# Patient Record
Sex: Female | Born: 1940 | Race: White | Hispanic: No | Smoking: Former smoker
Health system: Southern US, Community
[De-identification: ages and names within clinical notes are randomized; demographics above are authoritative.]

## PROBLEM LIST (undated history)

## (undated) DIAGNOSIS — F32A Depression, unspecified: Secondary | ICD-10-CM

## (undated) DIAGNOSIS — E119 Type 2 diabetes mellitus without complications: Secondary | ICD-10-CM

## (undated) DIAGNOSIS — N189 Chronic kidney disease, unspecified: Secondary | ICD-10-CM

## (undated) DIAGNOSIS — K219 Gastro-esophageal reflux disease without esophagitis: Secondary | ICD-10-CM

## (undated) DIAGNOSIS — C801 Malignant (primary) neoplasm, unspecified: Secondary | ICD-10-CM

## (undated) HISTORY — PX: ABDOMINAL HYSTERECTOMY: SHX81

---

## 2011-09-07 HISTORY — PX: OTHER SURGICAL HISTORY: SHX169

## 2013-03-31 DIAGNOSIS — I2510 Atherosclerotic heart disease of native coronary artery without angina pectoris: Secondary | ICD-10-CM | POA: Diagnosis present

## 2017-10-23 DIAGNOSIS — F329 Major depressive disorder, single episode, unspecified: Secondary | ICD-10-CM | POA: Diagnosis present

## 2018-01-24 DIAGNOSIS — I10 Essential (primary) hypertension: Secondary | ICD-10-CM | POA: Diagnosis present

## 2019-12-03 DIAGNOSIS — Z951 Presence of aortocoronary bypass graft: Secondary | ICD-10-CM

## 2021-08-03 ENCOUNTER — Ambulatory Visit (INDEPENDENT_AMBULATORY_CARE_PROVIDER_SITE_OTHER): Payer: Medicare (Managed Care)

## 2021-08-03 ENCOUNTER — Other Ambulatory Visit: Payer: Self-pay

## 2021-08-03 ENCOUNTER — Encounter: Payer: Self-pay | Admitting: Emergency Medicine

## 2021-08-03 ENCOUNTER — Ambulatory Visit
Admission: EM | Admit: 2021-08-03 | Discharge: 2021-08-03 | Disposition: A | Discharge status: Home / Self Care | Payer: Medicare (Managed Care)

## 2021-08-03 DIAGNOSIS — J069 Acute upper respiratory infection, unspecified: Secondary | ICD-10-CM

## 2021-08-03 DIAGNOSIS — R059 Cough, unspecified: Secondary | ICD-10-CM | POA: Diagnosis not present

## 2021-08-03 DIAGNOSIS — N189 Chronic kidney disease, unspecified: Secondary | ICD-10-CM

## 2021-08-03 DIAGNOSIS — K219 Gastro-esophageal reflux disease without esophagitis: Secondary | ICD-10-CM

## 2021-08-03 HISTORY — DX: Chronic kidney disease, unspecified: N18.9

## 2021-08-03 HISTORY — DX: Gastro-esophageal reflux disease without esophagitis: K21.9

## 2021-08-03 MED ORDER — AZITHROMYCIN 250 MG PO TABS: ORAL_TABLET | ORAL | 0 refills | Status: DC

## 2021-08-03 MED ORDER — BENZONATATE 100 MG PO CAPS: 100.0000 mg | ORAL_CAPSULE | Freq: Three times a day (TID) | ORAL | 0 refills | Status: DC

## 2021-08-03 NOTE — ED Triage Notes (Signed)
Pt presents today with c/o of cough with chest congestion ongoing for two weeks. Denies fever.

## 2021-08-03 NOTE — Discharge Instructions (Signed)
Your chest x-ray today did not show any signs of infection however it did show an apical nodule, I would like you to call your primary doctor and have them review the image and give you guidance on what to do next  If it is recommended that she follow-up with her doctor locally I have given you information for pulmonology  Take a Zithromax as prescribed to cover for any infection  Take Tessalon every 8 hours as needed to help calm coughing  May continue use of Mucinex DM to help with congestion and coughing  May follow-up with urgent care as needed

## 2021-08-03 NOTE — ED Provider Notes (Signed)
[facility]    CSN: [id number] Arrival date & time: 08/03/21  1430      History   Chief Complaint Chief Complaint  Patient presents with   Cough   chest congestion    HPI Kari Woods is a 80 y.o. female.   Patient presents with nasal congestion, sore throat, productive cough and increased shortness of breath for 14 days.  Has attempted use of tylenol cold and flu and mucinex DM, somewhat helpful. History of CKD, amyloidosis, GERD, diabetes, bladder cancer. Known sick contacts.  Denies fever, chills, body aches, rhinorrhea, ear pain, headaches, rash wheezing, abdominal pain, nausea, vomiting, diarrhea.  Patient is a resident of [place] and is currently here for winter vacation, with plans to stay until March 2023.    Past Medical History:  Diagnosis Date   CKD (chronic kidney disease)    GERD (gastroesophageal reflux disease)     Patient Active Problem List   Diagnosis Date Noted   GERD (gastroesophageal reflux disease) 08/03/2021   CKD (chronic kidney disease) 08/03/2021    Past Surgical History:  Procedure Laterality Date   ABDOMINAL HYSTERECTOMY      OB History   No obstetric history on file.      Home Medications    Prior to Admission medications   Medication Sig Start Date End Date Taking? Authorizing Provider  pantoprazole (PROTONIX) 40 MG tablet Take by mouth. 07/22/16 08/08/21 Yes [provider]  gabapentin (NEURONTIN) 300 MG capsule Take 300 mg by mouth 2 (two) times daily. 07/16/21   [provider]  sertraline (ZOLOFT) 25 MG tablet Take 25 mg by mouth daily. 07/15/21   [provider]    Family History History reviewed. No pertinent family history.  Social History Social History   Tobacco Use   Smoking status: Former    Types: Cigarettes   Smokeless tobacco: Never  Vaping Use   Vaping Use: Never used  Substance Use Topics   Alcohol use: Not Currently   Drug use: Not Currently     Allergies    Patient has no known allergies.   Review of Systems Review of Systems  Constitutional: Negative.   HENT:  Positive for congestion and sore throat. Negative for dental problem, drooling, ear discharge, ear pain, facial swelling, hearing loss, mouth sores, nosebleeds, postnasal drip, rhinorrhea, sinus pressure, sinus pain, sneezing, tinnitus, trouble swallowing and voice change.   Respiratory:  Positive for cough and shortness of breath. Negative for apnea, choking, chest tightness, wheezing and stridor.   Gastrointestinal: Negative.   Skin: Negative.   Neurological: Negative.     Physical Exam Triage Vital Signs ED Triage Vitals  Enc Vitals Group     BP 08/03/21 1456 104/89     Pulse Rate 08/03/21 1456 90     Resp 08/03/21 1456 18     Temp 08/03/21 1456 97.7 F (36.5 C)     Temp Source 08/03/21 1456 Oral     SpO2 08/03/21 1456 99 %     Weight 08/03/21 1459 110 lb (49.9 kg)     Height 08/03/21 1459 5\' 8"  (1.727 m)     Head Circumference --      Peak Flow --      Pain Score 08/03/21 1458 2     Pain Loc --      Pain Edu? --      Excl. in [place]? --    No data found.  Updated Vital Signs BP 104/89 (BP Location: Left  Arm)   Pulse 90   Temp 97.7 F (36.5 C) (Oral)   Resp 18   Ht 5\' 8"  (1.727 m)   Wt 110 lb (49.9 kg)   SpO2 99%   BMI 16.73 kg/m   Visual Acuity Right Eye Distance:   Left Eye Distance:   Bilateral Distance:    Right Eye Near:   Left Eye Near:    Bilateral Near:     Physical Exam Constitutional:      Appearance: Normal appearance. She is normal weight.  HENT:     Head: Normocephalic.     Right Ear: Tympanic membrane, ear canal and external ear normal.     Left Ear: Tympanic membrane, ear canal and external ear normal.     Nose: Congestion present. No rhinorrhea.     Mouth/Throat:     Mouth: Mucous membranes are moist.     Pharynx: Posterior oropharyngeal erythema present.  Eyes:     Extraocular Movements: Extraocular movements intact.   Cardiovascular:     Rate and Rhythm: Normal rate and regular rhythm.     Pulses: Normal pulses.     Heart sounds: Normal heart sounds.  Pulmonary:     Effort: Pulmonary effort is normal.     Breath sounds: Normal breath sounds.  Musculoskeletal:     Cervical back: Normal range of motion and neck supple.  Skin:    General: Skin is warm and dry.  Neurological:     Mental Status: She is alert and oriented to person, place, and time. Mental status is at baseline.  Psychiatric:        Mood and Affect: Mood normal.        Behavior: Behavior normal.     UC Treatments / Results  Labs (all labs ordered are listed, but only abnormal results are displayed) Labs Reviewed - No data to display  EKG   Radiology No results found.  Procedures Procedures (including critical care time)  Medications Ordered in UC Medications - No data to display  Initial Impression / Assessment and Plan / UC Course  I have reviewed the triage vital signs and the nursing notes.  Pertinent labs & imaging results that were available during my care of the patient were reviewed by me and considered in my medical decision making (see chart for details).  Viral URI with cough  1.  Chest x-ray negative for infection, showing apical nodularities, discussed findings with patient, recommended notifying physician in [place] for review of imaging and recommended follow-up, given walking referral to local pulmonologist if follow-up required, unable to review patient's most recent imaging 2.  Zithromax 500 mg once then 250 mg for 4 days 3.  Tessalon 100 mg 3 times daily as needed 4.  Recommended continued use of Mucinex DM extra strength 5.  Urgent care follow-up as needed Final Clinical Impressions(s) / UC Diagnoses   Final diagnoses:  None   Discharge Instructions   None    ED Prescriptions   None    PDMP not reviewed this encounter.   Hans Eden, NP 08/03/21 [phone]

## 2021-10-07 ENCOUNTER — Inpatient Hospital Stay
Admission: EM | Admit: 2021-10-07 | Discharge: 2021-10-11 | DRG: 698 | Disposition: A | Discharge status: Home / Self Care | Payer: Medicare (Managed Care) | Attending: Family Medicine | Admitting: Family Medicine

## 2021-10-07 ENCOUNTER — Emergency Department: Payer: Medicare (Managed Care)

## 2021-10-07 ENCOUNTER — Encounter: Payer: Self-pay | Admitting: Internal Medicine

## 2021-10-07 ENCOUNTER — Other Ambulatory Visit: Payer: Self-pay

## 2021-10-07 DIAGNOSIS — I2510 Atherosclerotic heart disease of native coronary artery without angina pectoris: Secondary | ICD-10-CM | POA: Diagnosis present

## 2021-10-07 DIAGNOSIS — N390 Urinary tract infection, site not specified: Secondary | ICD-10-CM | POA: Diagnosis present

## 2021-10-07 DIAGNOSIS — E1165 Type 2 diabetes mellitus with hyperglycemia: Secondary | ICD-10-CM | POA: Diagnosis present

## 2021-10-07 DIAGNOSIS — T83511A Infection and inflammatory reaction due to indwelling urethral catheter, initial encounter: Secondary | ICD-10-CM | POA: Diagnosis not present

## 2021-10-07 DIAGNOSIS — D696 Thrombocytopenia, unspecified: Secondary | ICD-10-CM | POA: Diagnosis present

## 2021-10-07 DIAGNOSIS — Z90710 Acquired absence of both cervix and uterus: Secondary | ICD-10-CM | POA: Diagnosis not present

## 2021-10-07 DIAGNOSIS — E1122 Type 2 diabetes mellitus with diabetic chronic kidney disease: Secondary | ICD-10-CM | POA: Diagnosis present

## 2021-10-07 DIAGNOSIS — E785 Hyperlipidemia, unspecified: Secondary | ICD-10-CM | POA: Diagnosis present

## 2021-10-07 DIAGNOSIS — E872 Acidosis, unspecified: Secondary | ICD-10-CM | POA: Diagnosis present

## 2021-10-07 DIAGNOSIS — Z906 Acquired absence of other parts of urinary tract: Secondary | ICD-10-CM

## 2021-10-07 DIAGNOSIS — E876 Hypokalemia: Secondary | ICD-10-CM | POA: Diagnosis present

## 2021-10-07 DIAGNOSIS — J439 Emphysema, unspecified: Secondary | ICD-10-CM | POA: Diagnosis present

## 2021-10-07 DIAGNOSIS — I129 Hypertensive chronic kidney disease with stage 1 through stage 4 chronic kidney disease, or unspecified chronic kidney disease: Secondary | ICD-10-CM | POA: Diagnosis present

## 2021-10-07 DIAGNOSIS — Z8551 Personal history of malignant neoplasm of bladder: Secondary | ICD-10-CM | POA: Diagnosis not present

## 2021-10-07 DIAGNOSIS — Z853 Personal history of malignant neoplasm of breast: Secondary | ICD-10-CM

## 2021-10-07 DIAGNOSIS — K219 Gastro-esophageal reflux disease without esophagitis: Secondary | ICD-10-CM | POA: Diagnosis present

## 2021-10-07 DIAGNOSIS — Z681 Body mass index (BMI) 19 or less, adult: Secondary | ICD-10-CM

## 2021-10-07 DIAGNOSIS — Y846 Urinary catheterization as the cause of abnormal reaction of the patient, or of later complication, without mention of misadventure at the time of the procedure: Secondary | ICD-10-CM | POA: Diagnosis present

## 2021-10-07 DIAGNOSIS — F329 Major depressive disorder, single episode, unspecified: Secondary | ICD-10-CM | POA: Diagnosis present

## 2021-10-07 DIAGNOSIS — Z79899 Other long term (current) drug therapy: Secondary | ICD-10-CM

## 2021-10-07 DIAGNOSIS — Z634 Disappearance and death of family member: Secondary | ICD-10-CM

## 2021-10-07 DIAGNOSIS — T83518A Infection and inflammatory reaction due to other urinary catheter, initial encounter: Principal | ICD-10-CM | POA: Diagnosis present

## 2021-10-07 DIAGNOSIS — Z960 Presence of urogenital implants: Secondary | ICD-10-CM | POA: Diagnosis not present

## 2021-10-07 DIAGNOSIS — E43 Unspecified severe protein-calorie malnutrition: Secondary | ICD-10-CM | POA: Diagnosis present

## 2021-10-07 DIAGNOSIS — Z951 Presence of aortocoronary bypass graft: Secondary | ICD-10-CM

## 2021-10-07 DIAGNOSIS — Z794 Long term (current) use of insulin: Secondary | ICD-10-CM | POA: Diagnosis not present

## 2021-10-07 DIAGNOSIS — Z789 Other specified health status: Secondary | ICD-10-CM

## 2021-10-07 DIAGNOSIS — Z8673 Personal history of transient ischemic attack (TIA), and cerebral infarction without residual deficits: Secondary | ICD-10-CM | POA: Diagnosis not present

## 2021-10-07 DIAGNOSIS — F332 Major depressive disorder, recurrent severe without psychotic features: Secondary | ICD-10-CM | POA: Diagnosis present

## 2021-10-07 DIAGNOSIS — Z91030 Bee allergy status: Secondary | ICD-10-CM

## 2021-10-07 DIAGNOSIS — Z87891 Personal history of nicotine dependence: Secondary | ICD-10-CM

## 2021-10-07 DIAGNOSIS — Z66 Do not resuscitate: Secondary | ICD-10-CM | POA: Diagnosis present

## 2021-10-07 DIAGNOSIS — Z8572 Personal history of non-Hodgkin lymphomas: Secondary | ICD-10-CM

## 2021-10-07 DIAGNOSIS — Z87440 Personal history of urinary (tract) infections: Secondary | ICD-10-CM | POA: Diagnosis not present

## 2021-10-07 DIAGNOSIS — N1832 Chronic kidney disease, stage 3b: Secondary | ICD-10-CM | POA: Diagnosis present

## 2021-10-07 DIAGNOSIS — R1310 Dysphagia, unspecified: Secondary | ICD-10-CM | POA: Diagnosis not present

## 2021-10-07 DIAGNOSIS — Z20822 Contact with and (suspected) exposure to covid-19: Secondary | ICD-10-CM | POA: Diagnosis present

## 2021-10-07 DIAGNOSIS — G4700 Insomnia, unspecified: Secondary | ICD-10-CM | POA: Diagnosis present

## 2021-10-07 DIAGNOSIS — I10 Essential (primary) hypertension: Secondary | ICD-10-CM | POA: Diagnosis present

## 2021-10-07 DIAGNOSIS — A419 Sepsis, unspecified organism: Secondary | ICD-10-CM

## 2021-10-07 DIAGNOSIS — T83510A Infection and inflammatory reaction due to cystostomy catheter, initial encounter: Secondary | ICD-10-CM | POA: Diagnosis not present

## 2021-10-07 HISTORY — DX: Malignant (primary) neoplasm, unspecified: C80.1

## 2021-10-07 HISTORY — DX: Type 2 diabetes mellitus without complications: E11.9

## 2021-10-07 HISTORY — DX: Depression, unspecified: F32.A

## 2021-10-07 LAB — CBC WITH DIFFERENTIAL/PLATELET
Abs Immature Granulocytes: 0.07 10*3/uL (ref 0.00–0.07)
Basophils Absolute: 0 10*3/uL (ref 0.0–0.1)
Basophils Relative: 0 %
Eosinophils Absolute: 0 10*3/uL (ref 0.0–0.5)
Eosinophils Relative: 0 %
HCT: 34.7 % — ABNORMAL LOW (ref 36.0–46.0)
Hemoglobin: 11.7 g/dL — ABNORMAL LOW (ref 12.0–15.0)
Immature Granulocytes: 1 %
Lymphocytes Relative: 5 %
Lymphs Abs: 0.6 10*3/uL — ABNORMAL LOW (ref 0.7–4.0)
MCH: 30.2 pg (ref 26.0–34.0)
MCHC: 33.7 g/dL (ref 30.0–36.0)
MCV: 89.4 fL (ref 80.0–100.0)
Monocytes Absolute: 1.1 10*3/uL — ABNORMAL HIGH (ref 0.1–1.0)
Monocytes Relative: 9 %
Neutro Abs: 9.6 10*3/uL — ABNORMAL HIGH (ref 1.7–7.7)
Neutrophils Relative %: 85 %
Platelets: 156 10*3/uL (ref 150–400)
RBC: 3.88 MIL/uL (ref 3.87–5.11)
RDW: 13.5 % (ref 11.5–15.5)
WBC: 11.3 10*3/uL — ABNORMAL HIGH (ref 4.0–10.5)
nRBC: 0 % (ref 0.0–0.2)

## 2021-10-07 LAB — RESP PANEL BY RT-PCR (FLU A&B, COVID) ARPGX2
Influenza A by PCR: NEGATIVE
Influenza B by PCR: NEGATIVE
SARS Coronavirus 2 by RT PCR: NEGATIVE

## 2021-10-07 LAB — COMPREHENSIVE METABOLIC PANEL
ALT: 13 U/L (ref 0–44)
AST: 16 U/L (ref 15–41)
Albumin: 3.2 g/dL — ABNORMAL LOW (ref 3.5–5.0)
Alkaline Phosphatase: 68 U/L (ref 38–126)
Anion gap: 7 (ref 5–15)
BUN: 53 mg/dL — ABNORMAL HIGH (ref 8–23)
CO2: 18 mmol/L — ABNORMAL LOW (ref 22–32)
Calcium: 8.5 mg/dL — ABNORMAL LOW (ref 8.9–10.3)
Chloride: 111 mmol/L (ref 98–111)
Creatinine, Ser: 1.53 mg/dL — ABNORMAL HIGH (ref 0.44–1.00)
GFR, Estimated: 35 mL/min — ABNORMAL LOW (ref >60)
Glucose, Bld: 247 mg/dL — ABNORMAL HIGH (ref 70–99)
Potassium: 3.2 mmol/L — ABNORMAL LOW (ref 3.5–5.1)
Sodium: 136 mmol/L (ref 135–145)
Total Bilirubin: 1 mg/dL (ref 0.3–1.2)
Total Protein: 6.6 g/dL (ref 6.5–8.1)

## 2021-10-07 LAB — URINALYSIS, COMPLETE (UACMP) WITH MICROSCOPIC
Bilirubin Urine: NEGATIVE
Glucose, UA: 100 mg/dL — AB
Ketones, ur: NEGATIVE mg/dL
Nitrite: NEGATIVE
Protein, ur: 100 mg/dL — AB
Specific Gravity, Urine: 1.015 (ref 1.005–1.030)
pH: 8.5 — ABNORMAL HIGH (ref 5.0–8.0)

## 2021-10-07 LAB — LACTIC ACID, PLASMA
Lactic Acid, Venous: 1 mmol/L (ref 0.5–1.9)
Lactic Acid, Venous: 1.1 mmol/L (ref 0.5–1.9)

## 2021-10-07 LAB — APTT: aPTT: 32 seconds (ref 24–36)

## 2021-10-07 LAB — PROTIME-INR
INR: 1.2 (ref 0.8–1.2)
Prothrombin Time: 15.4 seconds — ABNORMAL HIGH (ref 11.4–15.2)

## 2021-10-07 MED ORDER — SODIUM CHLORIDE 0.9 % IV BOLUS (SEPSIS)
1000.0000 mL | Freq: Once | INTRAVENOUS | Status: AC
  Administered 2021-10-07: 1000 mL via INTRAVENOUS

## 2021-10-07 MED ORDER — SODIUM CHLORIDE 0.9 % IV SOLN
2.0000 g | Freq: Once | INTRAVENOUS | Status: AC
  Administered 2021-10-07: 2 g via INTRAVENOUS
  Filled 2021-10-07: qty 20

## 2021-10-07 MED ORDER — LACTATED RINGERS IV SOLN: INTRAVENOUS | Status: AC

## 2021-10-07 NOTE — ED Triage Notes (Addendum)
Pt arrives with [facility] from home c/o possible UTI; pt is visiting from out of town and has hx of sepsis and UTI. Pt has suprapubic cath and reports "dark brown/bloody urine". PT given 500 ml fluid bolus by EMS. Daughter is POA and lives in [place].  EMS vitals:  100.7 oral temp 132/67 BP RR 24 96% O2 on RA 95 HR

## 2021-10-07 NOTE — Sepsis Progress Note (Signed)
Following for sepsis monitoring ?

## 2021-10-07 NOTE — ED Provider Notes (Signed)
[facility] Provider Note    Event Date/Time   First MD Initiated Contact with Patient 10/07/21 1922     (approximate)   History   Urinary Tract Infection   HPI  Kari Woods is a 81 y.o. female with history of chronic kidney disease, GERD who self caths who presents with dark urine and fever.  Patient reports her urine is dark and foul-smelling which is typical of when she has a urinary tract infection.  She also reports a fever at home of 101.0.  Denies significant abdominal pain.  No flank pain reported     Physical Exam   Triage Vital Signs: ED Triage Vitals  Enc Vitals Group     BP 10/07/21 1915 139/68     Pulse Rate 10/07/21 1915 95     Resp 10/07/21 2000 16     Temp 10/07/21 1915 (S) (!) 102.9 F (39.4 C)     Temp Source 10/07/21 1915 Rectal     SpO2 10/07/21 1915 97 %     Weight --      Height --      Head Circumference --      Peak Flow --      Pain Score --      Pain Loc --      Pain Edu? --      Excl. in [place]? --     Most recent vital signs: Vitals:   10/07/21 2100 10/07/21 2130  BP: (!) 141/65 118/90  Pulse: 80 77  Resp: 19 (!) 24  Temp:    SpO2: 100% 100%     General: Awake, no distress.  CV:  Good peripheral perfusion.  Regular rate and rhythm Resp:  Normal effort.  Abd:  No distention.  No abdominal tenderness to palpation no CVA tenderness    ED Results / Procedures / Treatments   Labs (all labs ordered are listed, but only abnormal results are displayed) Labs Reviewed  COMPREHENSIVE METABOLIC PANEL - Abnormal; Notable for the following components:      Result Value   Potassium 3.2 (*)    CO2 18 (*)    Glucose, Bld 247 (*)    BUN 53 (*)    Creatinine, Ser 1.53 (*)    Calcium 8.5 (*)    Albumin 3.2 (*)    GFR, Estimated 35 (*)    All other components within normal limits  CBC WITH DIFFERENTIAL/PLATELET - Abnormal; Notable for the following components:   WBC 11.3 (*)    Hemoglobin 11.7 (*)    HCT  34.7 (*)    Neutro Abs 9.6 (*)    Lymphs Abs 0.6 (*)    Monocytes Absolute 1.1 (*)    All other components within normal limits  PROTIME-INR - Abnormal; Notable for the following components:   Prothrombin Time 15.4 (*)    All other components within normal limits  RESP PANEL BY RT-PCR (FLU A&B, COVID) ARPGX2  CULTURE, BLOOD (ROUTINE X 2)  CULTURE, BLOOD (ROUTINE X 2)  URINE CULTURE  LACTIC ACID, PLASMA  LACTIC ACID, PLASMA  APTT  URINALYSIS, COMPLETE (UACMP) WITH MICROSCOPIC     EKG ED ECG REPORT I, Lavonia Drafts, the attending physician, personally viewed and interpreted this ECG.  Date: 10/07/2021   QRS Axis: normal Intervals: normal ST/T Wave abnormalities: normal Narrative Interpretation: no evidence of acute ischemia     RADIOLOGY Chest x-ray reviewed by me, no acute abnormality    PROCEDURES:  Critical Care performed:  Procedures   MEDICATIONS ORDERED IN ED: Medications  lactated ringers infusion ( Intravenous New Bag/Given 10/07/21 2204)  sodium chloride 0.9 % bolus 1,000 mL (0 mLs Intravenous Stopped 10/07/21 2236)  cefTRIAXone (ROCEPHIN) 2 g in sodium chloride 0.9 % 100 mL IVPB (0 g Intravenous Stopped 10/07/21 2147)     IMPRESSION / MDM / ASSESSMENT AND PLAN / ED COURSE  I reviewed the triage vital signs and the nursing notes.  Patient presents with history of self cath, dark urine, foul-smelling.  She has a fever of 481.8 here, certainly suspicious for sepsis/UTI  Lab work is notable for white blood cell count of 11.3, lactic acid is reassuring.  CMP notable for creatinine of 1.53, patient has a history of chronic kidney disease  Pending urinalysis, will treat with IV Rocephin given high likelihood of infection  COVID and flu tests are negative chest x-ray without evidence of pneumonia          FINAL CLINICAL IMPRESSION(S) / ED DIAGNOSES   Final diagnoses:  Sepsis without acute organ dysfunction, due to unspecified organism [facility])      Rx / DC Orders   ED Discharge Orders     None        Note:  This document was prepared using Dragon voice recognition software and may include unintentional dictation errors.   Lavonia Drafts, MD 10/07/21 2239

## 2021-10-07 NOTE — Progress Notes (Signed)
CODE SEPSIS - PHARMACY COMMUNICATION  **Broad Spectrum Antibiotics should be administered within 1 hour of Sepsis diagnosis**  Time Code Sepsis Called/Page Received: 1937  Antibiotics Ordered: ceftriaxone 1 gram x 1  Time of 1st antibiotic administration: 2043  Additional action taken by pharmacy: messaged @2027   If necessary, Name of Provider/Nurse Contacted: messaged RN    Wynelle Cleveland, PharmD Pharmacy Resident  10/07/2021 7:37 PM

## 2021-10-08 ENCOUNTER — Encounter: Payer: Self-pay | Admitting: Internal Medicine

## 2021-10-08 DIAGNOSIS — T83510A Infection and inflammatory reaction due to cystostomy catheter, initial encounter: Secondary | ICD-10-CM

## 2021-10-08 DIAGNOSIS — N390 Urinary tract infection, site not specified: Secondary | ICD-10-CM | POA: Diagnosis not present

## 2021-10-08 DIAGNOSIS — Z8551 Personal history of malignant neoplasm of bladder: Secondary | ICD-10-CM

## 2021-10-08 DIAGNOSIS — E1165 Type 2 diabetes mellitus with hyperglycemia: Secondary | ICD-10-CM

## 2021-10-08 DIAGNOSIS — F332 Major depressive disorder, recurrent severe without psychotic features: Secondary | ICD-10-CM

## 2021-10-08 DIAGNOSIS — E876 Hypokalemia: Secondary | ICD-10-CM

## 2021-10-08 DIAGNOSIS — Z8673 Personal history of transient ischemic attack (TIA), and cerebral infarction without residual deficits: Secondary | ICD-10-CM

## 2021-10-08 DIAGNOSIS — Z794 Long term (current) use of insulin: Secondary | ICD-10-CM

## 2021-10-08 LAB — CBC
HCT: 30 % — ABNORMAL LOW (ref 36.0–46.0)
Hemoglobin: 10.2 g/dL — ABNORMAL LOW (ref 12.0–15.0)
MCH: 30.3 pg (ref 26.0–34.0)
MCHC: 34 g/dL (ref 30.0–36.0)
MCV: 89 fL (ref 80.0–100.0)
Platelets: 140 10*3/uL — ABNORMAL LOW (ref 150–400)
RBC: 3.37 MIL/uL — ABNORMAL LOW (ref 3.87–5.11)
RDW: 13.3 % (ref 11.5–15.5)
WBC: 10 10*3/uL (ref 4.0–10.5)
nRBC: 0 % (ref 0.0–0.2)

## 2021-10-08 LAB — HEMOGLOBIN A1C
Hgb A1c MFr Bld: 7.5 % — ABNORMAL HIGH (ref 4.8–5.6)
Mean Plasma Glucose: 169 mg/dL

## 2021-10-08 LAB — BASIC METABOLIC PANEL
Anion gap: 4 — ABNORMAL LOW (ref 5–15)
BUN: 45 mg/dL — ABNORMAL HIGH (ref 8–23)
CO2: 18 mmol/L — ABNORMAL LOW (ref 22–32)
Calcium: 7.8 mg/dL — ABNORMAL LOW (ref 8.9–10.3)
Chloride: 114 mmol/L — ABNORMAL HIGH (ref 98–111)
Creatinine, Ser: 1.45 mg/dL — ABNORMAL HIGH (ref 0.44–1.00)
GFR, Estimated: 37 mL/min — ABNORMAL LOW (ref >60)
Glucose, Bld: 208 mg/dL — ABNORMAL HIGH (ref 70–99)
Potassium: 2.7 mmol/L — CL (ref 3.5–5.1)
Sodium: 136 mmol/L (ref 135–145)

## 2021-10-08 LAB — GLUCOSE, CAPILLARY
Glucose-Capillary: 239 mg/dL — ABNORMAL HIGH (ref 70–99)
Glucose-Capillary: 155 mg/dL — ABNORMAL HIGH (ref 70–99)
Glucose-Capillary: 198 mg/dL — ABNORMAL HIGH (ref 70–99)
Glucose-Capillary: 144 mg/dL — ABNORMAL HIGH (ref 70–99)
Glucose-Capillary: 193 mg/dL — ABNORMAL HIGH (ref 70–99)

## 2021-10-08 LAB — POTASSIUM: Potassium: 3.5 mmol/L (ref 3.5–5.1)

## 2021-10-08 MED ORDER — SERTRALINE HCL 50 MG PO TABS
50.0000 mg | ORAL_TABLET | Freq: Every day | ORAL | Status: DC
  Administered 2021-10-08: 50 mg via ORAL
  Filled 2021-10-08: qty 1

## 2021-10-08 MED ORDER — ESCITALOPRAM OXALATE 10 MG PO TABS
10.0000 mg | ORAL_TABLET | Freq: Every day | ORAL | Status: DC
  Administered 2021-10-08 – 2021-10-09 (×2): 10 mg via ORAL
  Filled 2021-10-08 (×2): qty 1

## 2021-10-08 MED ORDER — ENOXAPARIN SODIUM 30 MG/0.3ML IJ SOSY
30.0000 mg | PREFILLED_SYRINGE | INTRAMUSCULAR | Status: DC
  Administered 2021-10-08 – 2021-10-11 (×4): 30 mg via SUBCUTANEOUS
  Filled 2021-10-08 (×4): qty 0.3

## 2021-10-08 MED ORDER — ACETAMINOPHEN 650 MG RE SUPP: 650.0000 mg | Freq: Four times a day (QID) | RECTAL | Status: DC | PRN

## 2021-10-08 MED ORDER — POTASSIUM CHLORIDE CRYS ER 20 MEQ PO TBCR
40.0000 meq | EXTENDED_RELEASE_TABLET | Freq: Once | ORAL | Status: AC
  Administered 2021-10-08: 40 meq via ORAL
  Filled 2021-10-08: qty 2

## 2021-10-08 MED ORDER — ACETAMINOPHEN 325 MG PO TABS
650.0000 mg | ORAL_TABLET | Freq: Four times a day (QID) | ORAL | Status: DC | PRN
  Administered 2021-10-08 – 2021-10-10 (×2): 650 mg via ORAL
  Filled 2021-10-08 (×2): qty 2

## 2021-10-08 MED ORDER — ONDANSETRON HCL 4 MG/2ML IJ SOLN
4.0000 mg | Freq: Four times a day (QID) | INTRAMUSCULAR | Status: DC | PRN
  Administered 2021-10-09: 4 mg via INTRAVENOUS
  Filled 2021-10-08: qty 2

## 2021-10-08 MED ORDER — POTASSIUM CHLORIDE 10 MEQ/100ML IV SOLN: 10.0000 meq | INTRAVENOUS | Status: DC

## 2021-10-08 MED ORDER — ONDANSETRON HCL 4 MG PO TABS: 4.0000 mg | ORAL_TABLET | Freq: Four times a day (QID) | ORAL | Status: DC | PRN

## 2021-10-08 MED ORDER — POTASSIUM CHLORIDE 10 MEQ/100ML IV SOLN
10.0000 meq | INTRAVENOUS | Status: AC
  Administered 2021-10-08 (×2): 10 meq via INTRAVENOUS
  Filled 2021-10-08 (×2): qty 100

## 2021-10-08 MED ORDER — SODIUM CHLORIDE 0.9 % IV SOLN
1.0000 g | Freq: Two times a day (BID) | INTRAVENOUS | Status: DC
  Administered 2021-10-08 – 2021-10-10 (×5): 1 g via INTRAVENOUS
  Filled 2021-10-08 (×7): qty 1

## 2021-10-08 MED ORDER — INSULIN ASPART 100 UNIT/ML IJ SOLN
0.0000 [IU] | Freq: Every day | INTRAMUSCULAR | Status: DC
  Administered 2021-10-08: 2 [IU] via SUBCUTANEOUS
  Filled 2021-10-08 (×2): qty 1

## 2021-10-08 MED ORDER — INSULIN ASPART 100 UNIT/ML IJ SOLN
0.0000 [IU] | Freq: Three times a day (TID) | INTRAMUSCULAR | Status: DC
  Administered 2021-10-08 – 2021-10-09 (×4): 3 [IU] via SUBCUTANEOUS
  Administered 2021-10-09 (×2): 2 [IU] via SUBCUTANEOUS
  Administered 2021-10-10: 3 [IU] via SUBCUTANEOUS
  Administered 2021-10-10 (×2): 2 [IU] via SUBCUTANEOUS
  Filled 2021-10-08 (×9): qty 1

## 2021-10-08 NOTE — ED Notes (Signed)
Pt's daughter Mykia Holton is pt's POA (in [place]): [phone]

## 2021-10-08 NOTE — Consult Note (Signed)
[facility] Consult   Reason for Consult: Consult for 81 year old woman admitted to the hospital with multiple medical problems related to kidney disease.  Concern about depression. Referring Physician: [place] Crane Patient Identification: Kari Woods MRN:  [id number] Principal Diagnosis: Severe recurrent major depression without psychotic features ([place]) Diagnosis:  Principal Problem:   Severe recurrent major depression without psychotic features ([place]) Active Problems:   Stage 3b chronic kidney disease ([place])   Self-catheterizes continent urinary pouch ([place] pouch)   Catheter-associated urinary tract infection ([place])   Frequent UTI   Coronary artery disease involving native coronary artery   Hypertension, essential   Major depressive disorder   S/P coronary artery bypass graft x 5   History of TIA (transient ischemic attack)   History of bladder cancer s/p cystectectomy with [place] pouch   Uncontrolled type 2 diabetes mellitus with hyperglycemia, with long-term current use of insulin (HCC)   Hypokalemia   Total Time spent with patient: 45 minutes  Subjective:   Kari Woods is a 81 y.o. female patient admitted with "I have been depressed".  HPI: Patient seen and chart reviewed.  81 year old woman with multiple medical problems admitted to the hospital with abnormal laboratory findings chronic and worsening kidney disease.  Patient noted to be showing multiple symptoms of depression.  Patient states that her mood does feel depressed.  She feels sad and negative most of the time.  She says that she loves all of her family but gets little direct enjoyment from many of the grandchildren her children because they do not have the time to spend with her.  She has very little otherwise in her life that she enjoys spending most of her time just watching television.  Sleep is poor.  Appetite somewhat decreased.  Patient admits that she has thoughts at times of wishing to die but  adamantly states that she would never try to kill herself that she has philosophical and religious feelings against it.  She is willing and appropriate about cooperating with medical care.  Denies any hallucinations or psychotic symptoms.  Patient has been on Zoloft 50 mg a day for what she says is about 8 months.  The patient lives in [place] but has been in [place] since November visiting her sister and was planning to stay for at least another couple months.  Patient reports that the Zoloft has not been effective as far as she is concerned at any time.  Past Psychiatric History: Past history of depression for at least 8 months and in fact she says she started to have problems with her mood after her husband died in 12-17-2016.  No previous treatment before that and no history of suicide attempts.  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 in remission from all 3 since Dec 18, 2015   CKD (chronic kidney disease)    stage 3   Depression    Diabetes mellitus without complication (HCC)    GERD (gastroesophageal reflux disease)     Past Surgical History:  Procedure Laterality Date   ABDOMINAL HYSTERECTOMY     total   triple bypass  09/07/2011   Family History: History reviewed. No pertinent family history. Family Psychiatric  History: Unaware of any Social History:  Social History   Substance and  Sexual Activity  Alcohol Use Not Currently     Social History   Substance and Sexual Activity  Drug Use Not Currently    Social History   Socioeconomic History   Marital status: Widowed    Spouse name: Not on file   Number of children: Not on file   Years of education: Not on file   Highest education level: Not on file  Occupational History   Not on file  Tobacco Use   Smoking  status: Former    Types: Cigarettes   Smokeless tobacco: Never  Vaping Use   Vaping Use: Never used  Substance and Sexual Activity   Alcohol use: Not Currently   Drug use: Not Currently   Sexual activity: Not on file  Other Topics Concern   Not on file  Social History Narrative   Not on file   Social Determinants of Health   Financial Resource Strain: Not on file  Food Insecurity: Not on file  Transportation Needs: Not on file  Physical Activity: Not on file  Stress: Not on file  Social Connections: Not on file   Additional Social History:    Allergies:   Allergies  Allergen Reactions   Bee Venom Swelling and Other (See Comments)    Other reaction(s): Unknown Other reaction(s): Swelling/Edema Severity: Swelling/Edema - Moderate; Type: Animal;  Other reaction(s): Swelling/Edema Severity: Swelling/Edema - Moderate; Type: Animal;      Labs:  Results for orders placed or performed during the hospital encounter of 10/07/21 (from the past 48 hour(s))  Resp Panel by RT-PCR (Flu A&B, Covid) Nasopharyngeal Swab     Status: None   Collection Time: 10/07/21  7:35 PM   Specimen: Nasopharyngeal Swab; Nasopharyngeal(NP) swabs in vial transport medium  Result Value Ref Range   SARS Coronavirus 2 by RT PCR NEGATIVE NEGATIVE    Comment: (NOTE) SARS-CoV-2 target nucleic acids are NOT DETECTED.  The SARS-CoV-2 RNA is generally detectable in upper respiratory specimens during the acute phase of infection. The lowest concentration of SARS-CoV-2 viral copies this assay can detect is 138 copies/mL. A negative result does not preclude SARS-Cov-2 infection and should not be used as the sole basis for treatment or other patient management decisions. A negative result may occur with  improper specimen collection/handling, submission of specimen other than nasopharyngeal swab, presence of viral mutation(s) within the areas targeted by this assay, and inadequate number of  viral copies(<138 copies/mL). A negative result must be combined with clinical observations, patient history, and epidemiological information. The expected result is Negative.  Fact Sheet for Patients:  EntrepreneurPulse.com.au  Fact Sheet for Healthcare Providers:  IncredibleEmployment.be  This test is no t yet approved or cleared by the [place] FDA and  has been authorized for detection and/or diagnosis of SARS-CoV-2 by FDA under an Emergency Use Authorization (EUA). This EUA will remain  in effect (meaning this test can be used) for the duration of the COVID-19 declaration under Section 564(b)(1) of the Act, 21 U.S.C.section 360bbb-3(b)(1), unless the authorization is terminated  or revoked sooner.       Influenza A by PCR NEGATIVE NEGATIVE   Influenza B by PCR NEGATIVE NEGATIVE    Comment: (NOTE) The Xpert Xpress SARS-CoV-2/FLU/RSV plus assay is intended as an aid in the diagnosis of influenza from Nasopharyngeal swab specimens and should not be used as a sole basis for treatment. Nasal washings and aspirates are unacceptable for Xpert Xpress SARS-CoV-2/FLU/RSV testing.  Fact Sheet for Patients: EntrepreneurPulse.com.au  Fact Sheet for  Healthcare Providers: IncredibleEmployment.be  This test is not yet approved or cleared by the [place] and has been authorized for detection and/or diagnosis of SARS-CoV-2 by FDA under an Emergency Use Authorization (EUA). This EUA will remain in effect (meaning this test can be used) for the duration of the COVID-19 declaration under Section 564(b)(1) of the Act, 21 U.S.C. section 360bbb-3(b)(1), unless the authorization is terminated or revoked.  Performed at [facility], [address] [place], [place] [address]   Lactic acid, plasma     Status: None   Collection Time: 10/07/21  8:14 PM  Result Value Ref Range   Lactic Acid, Venous  1.0 0.5 - 1.9 mmol/L    Comment: Performed at [facility], [address] [place], [place] [address]  Comprehensive metabolic panel     Status: Abnormal   Collection Time: 10/07/21  8:14 PM  Result Value Ref Range   Sodium 136 135 - 145 mmol/L   Potassium 3.2 (L) 3.5 - 5.1 mmol/L    Comment: HEMOLYSIS AT THIS LEVEL MAY AFFECT RESULT   Chloride 111 98 - 111 mmol/L   CO2 18 (L) 22 - 32 mmol/L   Glucose, Bld 247 (H) 70 - 99 mg/dL    Comment: Glucose reference range applies only to samples taken after fasting for at least 8 hours.   BUN 53 (H) 8 - 23 mg/dL   Creatinine, Ser 1.53 (H) 0.44 - 1.00 mg/dL   Calcium 8.5 (L) 8.9 - 10.3 mg/dL   Total Protein 6.6 6.5 - 8.1 g/dL   Albumin 3.2 (L) 3.5 - 5.0 g/dL   AST 16 15 - 41 U/L   ALT 13 0 - 44 U/L   Alkaline Phosphatase 68 38 - 126 U/L   Total Bilirubin 1.0 0.3 - 1.2 mg/dL   GFR, Estimated 35 (L) >60 mL/min    Comment: (NOTE) Calculated using the CKD-EPI Creatinine Equation (2021)    Anion gap 7 5 - 15    Comment: Performed at [facility], [address] [place], [place] [address]  CBC WITH DIFFERENTIAL     Status: Abnormal   Collection Time: 10/07/21  8:14 PM  Result Value Ref Range   WBC 11.3 (H) 4.0 - 10.5 K/uL   RBC 3.88 3.87 - 5.11 MIL/uL   Hemoglobin 11.7 (L) 12.0 - 15.0 g/dL   HCT 34.7 (L) 36.0 - 46.0 %   MCV 89.4 80.0 - 100.0 fL   MCH 30.2 26.0 - 34.0 pg   MCHC 33.7 30.0 - 36.0 g/dL   RDW 13.5 11.5 - 15.5 %   Platelets 156 150 - 400 K/uL   nRBC 0.0 0.0 - 0.2 %   Neutrophils Relative % 85 %   Neutro Abs 9.6 (H) 1.7 - 7.7 K/uL   Lymphocytes Relative 5 %   Lymphs Abs 0.6 (L) 0.7 - 4.0 K/uL   Monocytes Relative 9 %   Monocytes Absolute 1.1 (H) 0.1 - 1.0 K/uL   Eosinophils Relative 0 %   Eosinophils Absolute 0.0 0.0 - 0.5 K/uL   Basophils Relative 0 %   Basophils Absolute 0.0 0.0 - 0.1 K/uL   Immature Granulocytes 1 %   Abs Immature Granulocytes 0.07 0.00 - 0.07 K/uL    Comment: Performed at  [facility], [address] [place], [place] [address]  Protime-INR     Status: Abnormal   Collection Time: 10/07/21  8:14 PM  Result Value Ref Range   Prothrombin Time  15.4 (H) 11.4 - 15.2 seconds   INR 1.2 0.8 - 1.2    Comment: (NOTE) INR goal varies based on device and disease states. Performed at [facility], [address] [place], [place] [address]   APTT     Status: None   Collection Time: 10/07/21  8:14 PM  Result Value Ref Range   aPTT 32 24 - 36 seconds    Comment: Performed at [facility], [address] [place], [place] [address]  Lactic acid, plasma     Status: None   Collection Time: 10/07/21 10:01 PM  Result Value Ref Range   Lactic Acid, Venous 1.1 0.5 - 1.9 mmol/L    Comment: Performed at [facility], [address] [place], [place] [address]  Urinalysis, Complete w Microscopic Urine, Suprapubic     Status: Abnormal   Collection Time: 10/07/21 10:01 PM  Result Value Ref Range   Color, Urine YELLOW YELLOW   APPearance CLEAR CLEAR   Specific Gravity, Urine 1.015 1.005 - 1.030   pH 8.5 (H) 5.0 - 8.0   Glucose, UA 100 (A) NEGATIVE mg/dL   Hgb urine dipstick TRACE (A) NEGATIVE   Bilirubin Urine NEGATIVE NEGATIVE   Ketones, ur NEGATIVE NEGATIVE mg/dL   Protein, ur 100 (A) NEGATIVE mg/dL   Nitrite NEGATIVE NEGATIVE   Leukocytes,Ua SMALL (A) NEGATIVE   Squamous Epithelial / LPF 0-5 0 - 5   WBC, UA 11-20 0 - 5 WBC/hpf   RBC / HPF 0-5 0 - 5 RBC/hpf   Bacteria, UA MANY (A) NONE SEEN    Comment: Performed at [facility], [address] [place], [place] [address]  Glucose, capillary     Status: Abnormal   Collection Time: 10/08/21  1:24 AM  Result Value Ref Range   Glucose-Capillary 239 (H) 70 - 99 mg/dL    Comment: Glucose reference range applies only to samples taken after fasting for at least 8 hours.  Basic metabolic panel     Status: Abnormal   Collection Time: 10/08/21  3:56 AM  Result Value Ref Range    Sodium 136 135 - 145 mmol/L   Potassium 2.7 (LL) 3.5 - 5.1 mmol/L    Comment: CRITICAL RESULT CALLED TO, READ BACK BY AND VERIFIED WITH Doylene Canning RN [phone] 10/08/21 HNM    Chloride 114 (H) 98 - 111 mmol/L   CO2 18 (L) 22 - 32 mmol/L   Glucose, Bld 208 (H) 70 - 99 mg/dL    Comment: Glucose reference range applies only to samples taken after fasting for at least 8 hours.   BUN 45 (H) 8 - 23 mg/dL   Creatinine, Ser 1.45 (H) 0.44 - 1.00 mg/dL   Calcium 7.8 (L) 8.9 - 10.3 mg/dL   GFR, Estimated 37 (L) >60 mL/min    Comment: (NOTE) Calculated using the CKD-EPI Creatinine Equation (2021)    Anion gap 4 (L) 5 - 15    Comment: Performed at [facility], [address] [place], [place] [address]  CBC     Status: Abnormal   Collection Time: 10/08/21  3:56 AM  Result Value Ref Range   WBC 10.0 4.0 - 10.5 K/uL   RBC 3.37 (L) 3.87 - 5.11 MIL/uL   Hemoglobin 10.2 (L) 12.0 - 15.0 g/dL   HCT 30.0 (L) 36.0 - 46.0 %   MCV 89.0 80.0 - 100.0 fL   MCH 30.3 26.0 - 34.0 pg   MCHC 34.0 30.0 - 36.0 g/dL  RDW 13.3 11.5 - 15.5 %   Platelets 140 (L) 150 - 400 K/uL   nRBC 0.0 0.0 - 0.2 %    Comment: Performed at [facility], [address] [place], [place] [address]  Glucose, capillary     Status: Abnormal   Collection Time: 10/08/21  8:48 AM  Result Value Ref Range   Glucose-Capillary 155 (H) 70 - 99 mg/dL    Comment: Glucose reference range applies only to samples taken after fasting for at least 8 hours.   Comment 1 Notify RN    Comment 2 Document in Chart   Potassium     Status: None   Collection Time: 10/08/21 11:38 AM  Result Value Ref Range   Potassium 3.5 3.5 - 5.1 mmol/L    Comment: Performed at [facility], [address] [place], [place] [address]  Glucose, capillary     Status: Abnormal   Collection Time: 10/08/21 11:40 AM  Result Value Ref Range   Glucose-Capillary 198 (H) 70 - 99 mg/dL    Comment: Glucose reference range applies only to samples  taken after fasting for at least 8 hours.    Current Facility-Administered Medications  Medication Dose Route Frequency Provider Last Rate Last Admin   acetaminophen (TYLENOL) tablet 650 mg  650 mg Oral Q6H PRN Athena Masse, MD   650 mg at 10/08/21 0018   Or   acetaminophen (TYLENOL) suppository 650 mg  650 mg Rectal Q6H PRN Athena Masse, MD       enoxaparin (LOVENOX) injection 30 mg  30 mg Subcutaneous Q24H Judd Gaudier V, MD   30 mg at 10/08/21 0857   escitalopram (LEXAPRO) tablet 10 mg  10 mg Oral Daily Paizleigh Wilds, Madie Reno, MD       insulin aspart (novoLOG) injection 0-15 Units  0-15 Units Subcutaneous TID WC Athena Masse, MD   3 Units at 10/08/21 1242   insulin aspart (novoLOG) injection 0-5 Units  0-5 Units Subcutaneous QHS Athena Masse, MD   2 Units at 10/08/21 0157   meropenem (MERREM) 1 g in sodium chloride 0.9 % 100 mL IVPB  1 g Intravenous Q12H Hall, Carole N, DO       ondansetron (ZOFRAN) tablet 4 mg  4 mg Oral Q6H PRN Athena Masse, MD       Or   ondansetron [facility]) injection 4 mg  4 mg Intravenous Q6H PRN Athena Masse, MD        Musculoskeletal: Strength & Muscle Tone: decreased Gait & Station: unsteady Patient leans: N/A            Psychiatric Specialty Exam:  Presentation  General Appearance: No data recorded Eye Contact:No data recorded Speech:No data recorded Speech Volume:No data recorded Handedness:No data recorded  Mood and Affect  Mood:No data recorded Affect:No data recorded  Thought Process  Thought Processes:No data recorded Descriptions of Associations:No data recorded Orientation:No data recorded Thought Content:No data recorded History of Schizophrenia/Schizoaffective disorder:No data recorded Duration of Psychotic Symptoms:No data recorded Hallucinations:No data recorded Ideas of Reference:No data recorded Suicidal Thoughts:No data recorded Homicidal Thoughts:No data recorded  Sensorium  Memory:No data  recorded Judgment:No data recorded Insight:No data recorded  Executive Functions  Concentration:No data recorded Attention Span:No data recorded Recall:No data recorded Fund of Knowledge:No data recorded Language:No data recorded  Psychomotor Activity  Psychomotor Activity:No data recorded  Assets  Assets:No data recorded  Sleep  Sleep:No data recorded  Physical Exam: Physical Exam Vitals and nursing  note reviewed.  Constitutional:      Appearance: She is ill-appearing.  HENT:     Head: Normocephalic and atraumatic.     Mouth/Throat:     Pharynx: Oropharynx is clear.  Eyes:     Pupils: Pupils are equal, round, and reactive to light.  Cardiovascular:     Rate and Rhythm: Normal rate and regular rhythm.  Pulmonary:     Effort: Pulmonary effort is normal.     Breath sounds: Normal breath sounds.  Abdominal:     General: Abdomen is flat.     Palpations: Abdomen is soft.  Musculoskeletal:        General: Normal range of motion.  Skin:    General: Skin is warm and dry.  Neurological:     General: No focal deficit present.     Mental Status: She is alert. Mental status is at baseline.  Psychiatric:        Attention and Perception: Attention normal.        Mood and Affect: Mood is depressed.        Speech: Speech is delayed.        Behavior: Behavior is slowed.        Thought Content: Thought content includes suicidal ideation. Thought content does not include suicidal plan.        Cognition and Memory: Cognition normal.        Judgment: Judgment normal.   Review of Systems  Constitutional:  Positive for malaise/fatigue and weight loss.  HENT: Negative.    Eyes: Negative.   Respiratory: Negative.    Cardiovascular: Negative.   Gastrointestinal: Negative.   Musculoskeletal: Negative.   Skin: Negative.   Neurological:  Positive for sensory change and weakness.  Psychiatric/Behavioral:  Positive for depression and suicidal ideas. Negative for hallucinations and  substance abuse. The patient is nervous/anxious and has insomnia.   Blood pressure 115/68, pulse 71, temperature 98.4 F (36.9 C), temperature source Oral, resp. rate 18, height 5\' 8"  (1.727 m), weight 49.9 kg, SpO2 97 %. Body mass index is 16.73 kg/m.  Treatment Plan Summary: 81 year old woman with moderate to severe major depression without psychotic features.  Appropriately forthcoming about symptoms she has suicidal thoughts but no intent of acting on it.  Seems very sincere about this and I do not think she needs a sitter or inpatient treatment.  Patient would definitely benefit from continued aggressive treatment of depression.  I recommended switching medicines by discontinuing Zoloft and switching to Lexapro 10 mg instead.  Side effects explained to the patient and she is agreeable to this.  This should definitely be a major focus of treatment awareness going forward as she has not had any improvement in depression so far and is certainly a major factor limiting her quality of life.  Psychoeducation supplied to the patient.  We will follow up as needed   Disposition:  See note above.  No inpatient treatment indicated at this point but medication changes made  Alethia Berthold, MD 10/08/2021 4:52 PM

## 2021-10-08 NOTE — Progress Notes (Signed)
Spoke with Santiago Glad, patients daughter on the phone. She is upset no one has called to give her updates. We discussed the changes to her medications. Daughter states patient has already tried lexapro with no improvement and that patient is not compliant with medications so she may not have been taking her zoloft but is unsure. She also states they have tried Remeron in the past but it didn't help patient.   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  Daughter is a Marine scientist. Her information is in the chart now. She states she is healthcare proxy but doesn't know where paperwork is at.

## 2021-10-08 NOTE — Assessment & Plan Note (Addendum)
History of frequent UTIs including ESBL, seen on review of past urology notes History of prophylactic irrigation with tobramycin/gentamicin q 2 days up until November 2022 when she relocated to [place] from [place]

## 2021-10-08 NOTE — Assessment & Plan Note (Signed)
Renal function at baseline when compared to labs in [place]

## 2021-10-08 NOTE — Progress Notes (Signed)
Anticoagulation monitoring(Lovenox):  81 yo female ordered Lovenox 40 mg Q24h    Filed Weights   10/08/21 0137  Weight: 49.9 kg (110 lb)   BMI 16.7    Lab Results  Component Value Date   CREATININE 1.53 (H) 10/07/2021   Estimated Creatinine Clearance: 23.9 mL/min (A) (by C-G formula based on SCr of 1.53 mg/dL (H)). Hemoglobin & Hematocrit     Component Value Date/Time   HGB 11.7 (L) 10/07/2021 2014   HCT 34.7 (L) 10/07/2021 2014     Per Protocol for Patient with estCrcl < 30 ml/min and BMI < 40, will transition to Lovenox 30 mg Q24h.

## 2021-10-08 NOTE — Evaluation (Signed)
Occupational Therapy Evaluation Patient Details Name: Kari Woods MRN: [id number] DOB: 10-25-1940 Today's Date: 10/08/2021   History of Present Illness HPI: Kari Woods is a 81 y.o. female with medical history significant of DM, HTN, HLD, CKD 3B, TIA, CAD s/p CABG, depression, bladder cancer s/p cystectomy with [place] pouch who self catheterizes and with history of frequent UTIs including ESBL, with prior tobramycin irrigations every 2 days, until her relocation to [place] in November 2022 from [place] was seen for OT evaluation this date. Prior to hospital admission, pt was MOD I using SPC for limited mobility and ADLs. Pt living with sister in home c 1 STE, available 24/7. Pt presents to acute OT demonstrating impaired ADL performance and functional mobility 2/2 decreased activity tolerance and functional strength deficits. Pt currently requires significantly increased time to exit bed. SUPERVISION tooth brushing standing sinkside, tolerates ~5 min standing prior to returning to bed citing fatigue. Pt would benefit from skilled OT to increase activity toelrance and educate on ECS. Upon hospital discharge, recommend HHOT to maximize pt safety and return to PLOF.       Recommendations for follow up therapy are one component of a multi-disciplinary discharge planning process, led by the attending physician.  Recommendations may be updated based on patient status, additional functional criteria and insurance authorization.   Follow Up Recommendations  Home health OT    Assistance Recommended at Discharge Set up Supervision/Assistance  Patient can return home with the following Assistance with cooking/housework;Help with stairs or ramp for entrance    Functional Status Assessment  Patient has had a recent decline in their functional status and demonstrates the ability to make significant improvements in function in a reasonable and predictable  amount of time.  Equipment Recommendations  BSC/3in1    Recommendations for Other Services       Precautions / Restrictions Precautions Precautions: Fall Restrictions Weight Bearing Restrictions: No      Mobility Bed Mobility Overal bed mobility: Modified Independent             General bed mobility comments: significantly increased time    Transfers Overall transfer level: Needs assistance Equipment used: Rolling walker (2 wheels) Transfers: Sit to/from Stand Sit to Stand: Supervision           General transfer comment: MIN cues and increased time      Balance Overall balance assessment: No apparent balance deficits (not formally assessed)                                         ADL either performed or assessed with clinical judgement   ADL Overall ADL's : Needs assistance/impaired                                       General ADL Comments: SUPERVISION tooth brushing standing sinkside, tolerates ~5 min standing prior to returning to bed citing fatigue.      Pertinent Vitals/Pain Pain Assessment Pain Assessment: Faces Faces Pain Scale: Hurts a little bit Pain Location: stomach Pain Descriptors / Indicators: Aching Pain Intervention(s): Limited activity within patient's tolerance     Hand Dominance Right   Extremity/Trunk Assessment Upper Extremity Assessment Upper Extremity Assessment: Generalized weakness   Lower Extremity Assessment Lower Extremity Assessment: Generalized weakness  Communication Communication Communication: No difficulties   Cognition Arousal/Alertness: Awake/alert Behavior During Therapy: WFL for tasks assessed/performed Overall Cognitive Status: Within Functional Limits for tasks assessed                                                  Home Living Family/patient expects to be discharged to:: Private residence Living Arrangements: Other relatives  (sister) Available Help at Discharge: Family;Available 24 hours/day Type of Home: House Home Access: Stairs to enter [employer] of Steps: 1   Home Layout: One level               Home Equipment: Rollator (4 wheels);Cane - single point          Prior Functioning/Environment Prior Level of Function : Independent/Modified Independent             Mobility Comments: Pt reports she hasn't been doing any driving recently, and really has only been getting around minimally in the home with little community activity          OT Problem List: Decreased strength;Decreased activity tolerance      OT Treatment/Interventions: Self-care/ADL training;Therapeutic exercise;Energy conservation;DME and/or AE instruction;Therapeutic activities;Patient/family education;Balance training    OT Goals(Current goals can be found in the care plan section) Acute Rehab OT Goals Patient Stated Goal: to go home OT Goal Formulation: With patient Time For Goal Achievement: 10/22/21 Potential to Achieve Goals: Good ADL Goals Pt Will Perform Grooming: Independently;standing Pt Will Perform Lower Body Dressing: Independently;sit to/from stand Pt Will Transfer to Toilet: Independently;ambulating;regular height toilet  OT Frequency: Min 2X/week    Co-evaluation              AM-PAC OT "6 Clicks" Daily Activity     Outcome Measure Help from another person eating meals?: None Help from another person taking care of personal grooming?: A Little Help from another person toileting, which includes using toliet, bedpan, or urinal?: A Little Help from another person bathing (including washing, rinsing, drying)?: A Little Help from another person to put on and taking off regular upper body clothing?: None Help from another person to put on and taking off regular lower body clothing?: A Little 6 Click Score: 20   End of Session Equipment Utilized During Treatment: Rolling walker (2  wheels)  Activity Tolerance: Patient tolerated treatment well Patient left: in bed;with call bell/phone within reach;with bed alarm set  OT Visit Diagnosis: Muscle weakness (generalized) (M62.81)                Time: 9767-3419 OT Time Calculation (min): 17 min Charges:  OT General Charges $OT Visit: 1 Visit OT Evaluation $OT Eval Low Complexity: 1 Low OT Treatments $Self Care/Home Management : 8-22 mins  Dessie Coma, M.S. OTR/L  10/08/21, 10:56 AM  ascom [phone]

## 2021-10-08 NOTE — Assessment & Plan Note (Signed)
No complaints of chest pain

## 2021-10-08 NOTE — Assessment & Plan Note (Signed)
-   Continue home meds °

## 2021-10-08 NOTE — Assessment & Plan Note (Addendum)
Self-catheterization 4 times daily while awake, no cystoscopy performed if patient unable to Pouch care and stoma care Consider urology consult

## 2021-10-08 NOTE — Assessment & Plan Note (Signed)
Rocephin IV hydration

## 2021-10-08 NOTE — Assessment & Plan Note (Addendum)
S/p [place] pouch in 2004 No acute issues.   Patient has not set up care with a urologist as yet since her relocation to [facility] from [place] in 07/2021 Consider inpatient urology consult or outpatient referral

## 2021-10-08 NOTE — H&P (Addendum)
History and Physical    Patient: Kari Woods TKW:[id number] DOB: 04/11/1941 DOA: 10/07/2021 DOS: the patient was seen and examined on 10/08/2021 PCP: Pcp, No  Patient coming from: Home  Chief Complaint:  Chief Complaint  Patient presents with   Urinary Tract Infection    HPI: Kari Woods is a 81 y.o. female with medical history significant of DM, HTN, HLD, CKD 3B, TIA, CAD s/p CABG, depression, bladder cancer s/p cystectomy with [place] pouch who self catheterizes and with history of frequent UTIs including ESBL, with prior tobramycin irrigations every 2 days, until her relocation to [place] in November 2022 from [place], who presents to the ED with a complaint of dark and foul-smelling urine as well as fever.  She was in her usual state of health until 2 days ago when she started feeling weak and had decreased appetite, developing a fever on the day of arrival.  She denies abdominal pain, diarrhea, chest pain, shortness of breath or cough.  ED course: Tmax 102.9 with pulse of 95, BP 139/68  Blood work: WBC 11,000 with lactic acid 1-1 0.1 Urinalysis: Many bacteria, negative nitrite and small leukocyte esterase Creatinine 1.53 which is patient's baseline, bicarb 18 Glucose 248 Potassium 3.2 Hemoglobin 11.7  EKG, personally viewed and interpreted: Sinus at 73 with artifact but regular rhythm  Chest x-ray: Emphysema otherwise nonacute  Patient treated with Rocephin and IV fluids.  Hospitalist consulted for admission.   Review of Systems: As mentioned in the history of present illness. All other systems reviewed and are negative. Past Medical History:  Diagnosis Date   CKD (chronic kidney disease)    GERD (gastroesophageal reflux disease)    Past Surgical History:  Procedure Laterality Date   ABDOMINAL HYSTERECTOMY     Social History:  reports that she has quit smoking. Her smoking use included cigarettes. She has never used smokeless tobacco. She reports that she  does not currently use alcohol. She reports that she does not currently use drugs.  Allergies  Allergen Reactions   Bee Venom Swelling and Other (See Comments)    Other reaction(s): Unknown Other reaction(s): Swelling/Edema Severity: Swelling/Edema - Moderate; Type: Animal;  Other reaction(s): Swelling/Edema Severity: Swelling/Edema - Moderate; Type: Animal;      History reviewed. No pertinent family history.  Prior to Admission medications   Medication Sig Start Date End Date Taking? Authorizing Provider  gabapentin (NEURONTIN) 300 MG capsule Take 300 mg by mouth 2 (two) times daily. 07/16/21   [provider]  megestrol (MEGACE) 40 MG tablet Take 40 mg by mouth daily. 06/15/21   [provider]  pantoprazole (PROTONIX) 40 MG tablet Take by mouth. 07/22/16 08/08/21  [provider]  sertraline (ZOLOFT) 50 MG tablet Take 50 mg by mouth daily. 09/16/21   [provider]    Physical Exam: Vitals:   10/07/21 2030 10/07/21 2100 10/07/21 2130 10/07/21 2350  BP: 130/67 (!) 141/65 118/90 (!) 161/126  Pulse: 87 80 77 81  Resp: (!) 22 19 (!) 24 (!) 21  Temp:      TempSrc:      SpO2: 97% 100% 100% 100%   Physical Exam Vitals and nursing note reviewed.  Constitutional:      Appearance: She is ill-appearing.  HENT:     Head: Normocephalic and atraumatic.     Mouth/Throat:     Mouth: Mucous membranes are moist.  Eyes:     Conjunctiva/sclera: Conjunctivae normal.  Cardiovascular:     Rate and Rhythm: Normal  rate and regular rhythm.  Pulmonary:     Effort: Pulmonary effort is normal.  Abdominal:     General: Abdomen is flat.     Comments: Stoma in LLQ/periumbilical area. No surrounding erythema or warmth. No leaking  Musculoskeletal:        General: Normal range of motion.  Skin:    General: Skin is warm and dry.  Neurological:     General: No focal deficit present.     Mental Status: She is alert. Mental status is at baseline.  Psychiatric:         Mood and Affect: Mood normal.        Thought Content: Thought content normal.        Judgment: Judgment normal.     Data Reviewed:  Records from care everywhere from urologist and family physician, problem list Labs in [place] ED providers notes, triage notes, vitals, lab results, EKG, imaging.  Orders placed from the ED  Assessment and Plan: * Catheter-associated urinary tract infection ([place]) Rocephin IV hydration  Frequent UTI History of frequent UTIs including ESBL, seen on review of past urology notes History of prophylactic irrigation with tobramycin/gentamicin q 2 days up until November 2022 when she relocated to [place] from [place]  Self-catheterizes continent urinary pouch ([place] pouch) Self-catheterization 4 times daily while awake, no cystoscopy performed if patient unable to Pouch care and stoma care Consider urology consult  Hypokalemia Being repleted. Continue to monitor  History of bladder cancer s/p cystectectomy with [place] pouch S/p [place] pouch in 2004 No acute issues.   Patient has not set up care with a urologist as yet since her relocation to [facility] from [place] in 07/2021 Consider inpatient urology consult or outpatient referral  Uncontrolled type 2 diabetes mellitus with hyperglycemia, with long-term current use of insulin ([place]) Blood sugars in the mid 200s Sliding scale insulin coverage Resume basal insulin pending med rec  Stage 3b chronic kidney disease ([place]) Renal function at baseline when compared to labs in Care Everywhere  S/P coronary artery bypass graft x 5 Continue home meds  Coronary artery disease involving native coronary artery- (present on admission) No complaints of chest pain  History of TIA (transient ischemic attack) Continue home meds  Major depressive disorder- (present on admission) Continue home meds, sertraline Psych consult placed (due to triggered alert)  Hypertension, essential- (present on  admission) BP stable, continue home meds   Advance Care Planning:   Code Status: DNR  Consults: none  Family Communication: none  Severity of Illness: The appropriate patient status for this patient is INPATIENT. Inpatient status is judged to be reasonable and necessary in order to provide the required intensity of service to ensure the patient's safety. The patient's presenting symptoms, physical exam findings, and initial radiographic and laboratory data in the context of their chronic comorbidities is felt to place them at high risk for further clinical deterioration. Furthermore, it is not anticipated that the patient will be medically stable for discharge from the hospital within 2 midnights of admission.   * I certify that at the point of admission it is my clinical judgment that the patient will require inpatient hospital care spanning beyond 2 midnights from the point of admission due to high intensity of service, high risk for further deterioration and high frequency of surveillance required.*  Author: Athena Masse, MD 10/08/2021 12:03 AM  For on call review [email or web address].

## 2021-10-08 NOTE — Progress Notes (Signed)
Inpatient Diabetes Program Recommendations  AACE/ADA: New Consensus Statement on Inpatient Glycemic Control (2015)  Target Ranges:  Prepandial:   less than 140 mg/dL      Peak postprandial:   less than 180 mg/dL (1-2 hours)      Critically ill patients:  140 - 180 mg/dL   Lab Results  Component Value Date   GLUCAP 198 (H) 10/08/2021    Review of Glycemic Control  Latest Reference Range & Units 10/08/21 01:24 10/08/21 08:48 10/08/21 11:40  Glucose-Capillary 70 - 99 mg/dL 239 (H) 155 (H) 198 (H)   Diabetes history: DM  Outpatient Diabetes medications:  None Current orders for Inpatient glycemic control:  Novolog moderate tid with meals and HS  Inpatient Diabetes Program Recommendations:   Please consider reducing Novolog correction to sensitive tid with meals.  May also consider adding Semglee 5 units daily.   Thanks,  Adah Perl, RN, BC-ADM Inpatient Diabetes Coordinator Pager [phone]  (8a-5p)

## 2021-10-08 NOTE — TOC Initial Note (Signed)
Transition of Care [facility]) - Initial/Assessment Note    Patient Details  Name: Kari Woods MRN: [id number] Date of Birth: 1940/10/22  Transition of Care [facility]) CM/SW Contact:    Beverly Sessions, RN Phone Number: 10/08/2021, 3:15 PM  Clinical Narrative:                  Admitted for: UTI Admitted from: Home with sister.  States she has been staying with her sister since November, but plans to return to [place] PCP: in [place] Current home health/prior home health/DME: Radiation protection practitioner and cane  Therapy recommending home health.  Patient declines Expected Discharge Plan: Home/Self Care Barriers to Discharge: Continued Medical Work up   Patient Goals and CMS Choice        Expected Discharge Plan and Services Expected Discharge Plan: Home/Self Care   Discharge Planning Services: CM Consult   Living arrangements for the past 2 months: Single Family Home                                      Prior Living Arrangements/Services Living arrangements for the past 2 months: Single Family Home Lives with:: Siblings Patient language and need for interpreter reviewed:: Yes Do you feel safe going back to the place where you live?: Yes      Need for Family Participation in Patient Care: Yes (Comment) Care giver support system in place?: Yes (comment) Current home services: DME    Activities of Daily Living Home Assistive Devices/Equipment: Walker (specify type), Cane (specify quad or straight) ADL Screening (condition at time of admission) Patient's cognitive ability adequate to safely complete daily activities?: Yes Is the patient deaf or have difficulty hearing?: No Does the patient have difficulty seeing, even when wearing glasses/contacts?: No Does the patient have difficulty concentrating, remembering, or making decisions?: No Patient able to express need for assistance with ADLs?: Yes Does the patient have difficulty dressing or bathing?: Yes Independently  performs ADLs?: Yes (appropriate for developmental age) Communication: Independent Dressing (OT): Independent Grooming: Independent Feeding: Independent Bathing: Independent with device (comment) Toileting: Independent In/Out Bed: Independent with device (comment) Walks in Home: Independent with device (comment) Does the patient have difficulty walking or climbing stairs?: Yes Weakness of Legs: Both Weakness of Arms/Hands: None  Permission Sought/Granted                  Emotional Assessment       Orientation: : Oriented to Self, Oriented to Place, Oriented to  Time, Oriented to Situation Alcohol / Substance Use: Not Applicable Psych Involvement: No (comment)  Admission diagnosis:  UTI (urinary tract infection) [N39.0] Sepsis without acute organ dysfunction, due to unspecified organism [facility]) [A41.9] Patient Active Problem List   Diagnosis Date Noted   History of TIA (transient ischemic attack) 10/08/2021   History of bladder cancer s/p cystectectomy with [place] pouch 10/08/2021   Uncontrolled type 2 diabetes mellitus with hyperglycemia, with long-term current use of insulin ([place]) 10/08/2021   Hypokalemia 10/08/2021   Stage 3b chronic kidney disease ([place]) 10/07/2021   Self-catheterizes continent urinary pouch ([place] pouch) 10/07/2021   Catheter-associated urinary tract infection ([place]) 10/07/2021   Frequent UTI 10/07/2021   GERD (gastroesophageal reflux disease) 08/03/2021   CKD (chronic kidney disease) 08/03/2021   S/P coronary artery bypass graft x 5 12/03/2019   Hypertension, essential 01/24/2018   Major depressive disorder 10/23/2017   Coronary artery disease involving  native coronary artery 03/31/2013   PCP:  Pcp, No Pharmacy:   [facility] [address] [place] [facility] [place] [address] Phone: [phone] Fax: [phone]     Social Determinants of Health (SDOH) Interventions    Readmission Risk  Interventions No flowsheet data found.

## 2021-10-08 NOTE — Assessment & Plan Note (Signed)
Blood sugars in the mid 200s Sliding scale insulin coverage Resume basal insulin pending med rec

## 2021-10-08 NOTE — Assessment & Plan Note (Addendum)
Continue home meds, sertraline Psych consult placed (due to triggered alert)

## 2021-10-08 NOTE — Progress Notes (Signed)
°  HPI: Kari Woods is a 81 y.o. female with medical history significant of DM, HTN, HLD, CKD 3B, TIA, CAD s/p CABG, depression, bladder cancer s/p cystectomy with [place] pouch who self catheterizes and with history of frequent UTIs including ESBL, with prior tobramycin irrigations every 2 days, until her relocation to [place] in November 2022 from [place], who presents to the ED with a complaint of dark and foul-smelling urine as well as fever.  She was in her usual state of health until 2 days ago when she started feeling weak and had decreased appetite, developing a fever on the day of arrival.  She denies abdominal pain, diarrhea, chest pain, shortness of breath or cough.   ED course: Tmax 102.9 with pulse of 95, BP 139/68.  10/08/2021: Patient was seen and examined at bedside.  She reports her weakness is worse.  Switch IV antibiotics to [place].  We will continue to treat and closely monitor as indicated.

## 2021-10-08 NOTE — Assessment & Plan Note (Signed)
BP stable, continue home meds

## 2021-10-08 NOTE — Assessment & Plan Note (Signed)
Being repleted. Continue to monitor

## 2021-10-08 NOTE — Evaluation (Signed)
Physical Therapy Evaluation Patient Details Name: Kari Woods MRN: [id number] DOB: [birth year] Today's Date: 10/08/2021  History of Present Illness  HPI: Kari Woods is a 81 y.o. female with medical history significant of DM, HTN, HLD, CKD 3B, TIA, CAD s/p CABG, depression, bladder cancer s/p cystectomy with [place] pouch who self catheterizes and with history of frequent UTIs including ESBL, with prior tobramycin irrigations every 2 days, until her relocation to [place] in November 2022 from [place]  Pt pleasant and willing to work with PT.  She was able to ambulate ~35 ft in the room but had some nausea and was not able to do longer bout of ambulation - pt reports that she has not really done any prolonged ambulation recently, mostly uses cane but has 4WW.  Pt was slow and effortful with transition supine to sit but did not need assist and reports feeling only minimally weaker than her baseline.  Pt could benefit from HHPT, however she assures me she will try to be active and consistently up and walking for exercises when she is medically cleared to go home.    Recommendations for follow up therapy are one component of a multi-disciplinary discharge planning process, led by the attending physician.  Recommendations may be updated based on patient status, additional functional criteria and insurance authorization.  Follow Up Recommendations Home health PT (pt reports she thiks she will not need PT at home once she is feeling better, certainly possible per progress.)    Assistance Recommended at Discharge Set up Supervision/Assistance  Patient can return home with the following  Assist for transportation;Assistance with cooking/housework    Equipment Recommendations  (pt has 4WW, not interested in [place] (reports she has one up in [place]))  Recommendations for Other Services       Functional Status Assessment Patient has had a recent decline in their functional  status and demonstrates the ability to make significant improvements in function in a reasonable and predictable amount of time.     Precautions / Restrictions Precautions Precautions: Fall Restrictions Weight Bearing Restrictions: No      Mobility  Bed Mobility Overal bed mobility: Modified Independent             General bed mobility comments: Pt was able to transition supine to side to sitting EOB with heavy UE use on rails, but no need for direct assist    Transfers Overall transfer level: Modified independent Equipment used: Rolling walker (2 wheels)               General transfer comment: Pt was able to rise w/o assist, minimal cuing to insure appropriate positioning, sequencing and RW use    Ambulation/Gait Ambulation/Gait assistance: Supervision Gait Distance (Feet): 35 Feet Assistive device: Rolling walker (2 wheels)         General Gait Details: Pt was able to ambulate in-room to door and back with good confidence and no safety concerns, but did have nausea and need to sit (no vertigo/dizziness).  Once in sitting pt did have bout of vomiting - reports this is not uncommon with activity.  Stairs            Wheelchair Mobility    Modified Rankin (Stroke Patients Only)       Balance Overall balance assessment: Modified Independent (Pt reports one fall in last 6 months while carrying groceries, no LOBs today with AD use and a few steps w/o UEs.  chronic neuropathy with only mild foot  placement/coordination issues)                                           Pertinent Vitals/Pain Pain Assessment Pain Assessment: 0-10 Pain Score: 3  Pain Location: "sore from being in bed"    Home Living Family/patient expects to be discharged to:: Private residence Living Arrangements: Other relatives (sister) Available Help at Discharge: Family;Available 24 hours/day           Home Layout: One level Home Equipment: Rollator (4  wheels);Cane - single point      Prior Function Prior Level of Function : Independent/Modified Independent             Mobility Comments: Pt reports she hasn't been doing any driving recently, and really has only been getting around minimally in the home with little community activity       Hand Dominance        Extremity/Trunk Assessment   Upper Extremity Assessment Upper Extremity Assessment: Generalized weakness    Lower Extremity Assessment Lower Extremity Assessment: Generalized weakness (b/l LE neuropathy, AROM but weak)       Communication   Communication: No difficulties  Cognition Arousal/Alertness: Awake/alert Behavior During Therapy: WFL for tasks assessed/performed Overall Cognitive Status: Within Functional Limits for tasks assessed                                          General Comments      Exercises     Assessment/Plan    PT Assessment Patient needs continued PT services  PT Problem List Decreased strength;Decreased activity tolerance;Decreased balance;Decreased mobility;Decreased knowledge of use of DME;Decreased safety awareness       PT Treatment Interventions DME instruction;Gait training;Stair training;Functional mobility training;Therapeutic activities;Therapeutic exercise;Balance training;Neuromuscular re-education;Patient/family education    PT Goals (Current goals can be found in the Care Plan section)  Acute Rehab PT Goals Patient Stated Goal: get feeling better and go home PT Goal Formulation: With patient Time For Goal Achievement: 10/22/21 Potential to Achieve Goals: Good    Frequency Min 2X/week     Co-evaluation               AM-PAC PT "6 Clicks" Mobility  Outcome Measure Help needed turning from your back to your side while in a flat bed without using bedrails?: None Help needed moving from lying on your back to sitting on the side of a flat bed without using bedrails?: A Little Help needed  moving to and from a bed to a chair (including a wheelchair)?: None Help needed standing up from a chair using your arms (e.g., wheelchair or bedside chair)?: None Help needed to walk in hospital room?: A Little Help needed climbing 3-5 steps with a railing? : A Little 6 Click Score: 21    End of Session Equipment Utilized During Treatment: Gait belt Activity Tolerance: Patient tolerated treatment well Patient left: with chair alarm set;with call bell/phone within reach Nurse Communication: Mobility status (vomited post ambulation) PT Visit Diagnosis: Muscle weakness (generalized) (M62.81);Difficulty in walking, not elsewhere classified (R26.2)    Time: 6811-5726 PT Time Calculation (min) (ACUTE ONLY): 29 min   Charges:   PT Evaluation $PT Eval Low Complexity: 1 Low PT Treatments $Gait Training: 8-22 mins        Nethan Caudillo  Irven Shelling, DPT 10/08/2021, 10:37 AM

## 2021-10-09 ENCOUNTER — Inpatient Hospital Stay: Payer: Medicare (Managed Care) | Admitting: Radiology

## 2021-10-09 DIAGNOSIS — F332 Major depressive disorder, recurrent severe without psychotic features: Secondary | ICD-10-CM | POA: Diagnosis not present

## 2021-10-09 HISTORY — PX: IR CV LINE INJECTION: IMG2294

## 2021-10-09 LAB — CBC
HCT: 29.9 % — ABNORMAL LOW (ref 36.0–46.0)
Hemoglobin: 10.2 g/dL — ABNORMAL LOW (ref 12.0–15.0)
MCH: 30 pg (ref 26.0–34.0)
MCHC: 34.1 g/dL (ref 30.0–36.0)
MCV: 87.9 fL (ref 80.0–100.0)
Platelets: 135 10*3/uL — ABNORMAL LOW (ref 150–400)
RBC: 3.4 MIL/uL — ABNORMAL LOW (ref 3.87–5.11)
RDW: 13.3 % (ref 11.5–15.5)
WBC: 5.7 10*3/uL (ref 4.0–10.5)
nRBC: 0 % (ref 0.0–0.2)

## 2021-10-09 LAB — GLUCOSE, CAPILLARY
Glucose-Capillary: 176 mg/dL — ABNORMAL HIGH (ref 70–99)
Glucose-Capillary: 124 mg/dL — ABNORMAL HIGH (ref 70–99)
Glucose-Capillary: 164 mg/dL — ABNORMAL HIGH (ref 70–99)
Glucose-Capillary: 184 mg/dL — ABNORMAL HIGH (ref 70–99)

## 2021-10-09 LAB — BASIC METABOLIC PANEL
Anion gap: 7 (ref 5–15)
BUN: 29 mg/dL — ABNORMAL HIGH (ref 8–23)
CO2: 19 mmol/L — ABNORMAL LOW (ref 22–32)
Calcium: 8.2 mg/dL — ABNORMAL LOW (ref 8.9–10.3)
Chloride: 113 mmol/L — ABNORMAL HIGH (ref 98–111)
Creatinine, Ser: 1.33 mg/dL — ABNORMAL HIGH (ref 0.44–1.00)
GFR, Estimated: 41 mL/min — ABNORMAL LOW (ref >60)
Glucose, Bld: 206 mg/dL — ABNORMAL HIGH (ref 70–99)
Potassium: 2.7 mmol/L — CL (ref 3.5–5.1)
Sodium: 139 mmol/L (ref 135–145)

## 2021-10-09 LAB — MAGNESIUM: Magnesium: 1.9 mg/dL (ref 1.7–2.4)

## 2021-10-09 LAB — PHOSPHORUS: Phosphorus: 1.2 mg/dL — ABNORMAL LOW (ref 2.5–4.6)

## 2021-10-09 MED ORDER — BUPROPION HCL ER (XL) 150 MG PO TB24
150.0000 mg | ORAL_TABLET | Freq: Every day | ORAL | Status: DC
  Administered 2021-10-10 – 2021-10-11 (×2): 150 mg via ORAL
  Filled 2021-10-09 (×2): qty 1

## 2021-10-09 MED ORDER — ALUM & MAG HYDROXIDE-SIMETH 200-200-20 MG/5ML PO SUSP
30.0000 mL | Freq: Once | ORAL | Status: DC
  Filled 2021-10-09: qty 30

## 2021-10-09 MED ORDER — GABAPENTIN 300 MG PO CAPS
300.0000 mg | ORAL_CAPSULE | Freq: Two times a day (BID) | ORAL | Status: DC
  Administered 2021-10-09 – 2021-10-11 (×5): 300 mg via ORAL
  Filled 2021-10-09 (×5): qty 1

## 2021-10-09 MED ORDER — POTASSIUM CHLORIDE 20 MEQ PO PACK
40.0000 meq | PACK | Freq: Once | ORAL | Status: AC
  Administered 2021-10-09: 40 meq via ORAL
  Filled 2021-10-09: qty 2

## 2021-10-09 MED ORDER — MEGESTROL ACETATE 20 MG PO TABS
40.0000 mg | ORAL_TABLET | Freq: Every day | ORAL | Status: DC
  Administered 2021-10-09 – 2021-10-11 (×3): 40 mg via ORAL
  Filled 2021-10-09 (×4): qty 2

## 2021-10-09 MED ORDER — POTASSIUM CHLORIDE 2 MEQ/ML IV SOLN
INTRAVENOUS | Status: DC
  Filled 2021-10-09 (×4): qty 1000

## 2021-10-09 MED ORDER — POTASSIUM PHOSPHATES 15 MMOLE/5ML IV SOLN
30.0000 mmol | Freq: Two times a day (BID) | INTRAVENOUS | Status: AC
  Administered 2021-10-09 – 2021-10-10 (×2): 30 mmol via INTRAVENOUS
  Filled 2021-10-09 (×2): qty 10

## 2021-10-09 MED ORDER — CHLORHEXIDINE GLUCONATE CLOTH 2 % EX PADS
6.0000 | MEDICATED_PAD | Freq: Every day | CUTANEOUS | Status: DC
  Administered 2021-10-09 – 2021-10-11 (×3): 6 via TOPICAL

## 2021-10-09 MED ORDER — LIDOCAINE VISCOUS HCL 2 % MT SOLN
15.0000 mL | Freq: Once | OROMUCOSAL | Status: DC
  Filled 2021-10-09: qty 15

## 2021-10-09 MED ORDER — IOHEXOL 350 MG/ML SOLN
8.0000 mL | Freq: Once | INTRAVENOUS | Status: AC | PRN
  Administered 2021-10-09: 8 mL via INTRAVENOUS

## 2021-10-09 MED ORDER — PANTOPRAZOLE SODIUM 40 MG PO TBEC
40.0000 mg | DELAYED_RELEASE_TABLET | Freq: Every day | ORAL | Status: DC
  Administered 2021-10-09 – 2021-10-11 (×3): 40 mg via ORAL
  Filled 2021-10-09 (×3): qty 1

## 2021-10-09 NOTE — Progress Notes (Addendum)
PROGRESS NOTE  Kari Woods GUR:[id number] DOB: Mar 03, 1941 DOA: 10/07/2021 PCP: Pcp, No  HPI/Recap of past 24 hours: Kari Woods is a 81 y.o. female with medical history significant of DM2, HTN, HLD, CKD 3B, TIA, CAD s/p CABG, chronic depression, bladder cancer s/p cystectomy with [place] pouch who self catheterizes, history of frequent UTIs including ESBL, with prior tobramycin irrigations every 2 days, until her relocation to [place] in November 2022 from [place], who presents [facility] ED with a complaint of dark and foul-smelling urine as well as subjective fever.  She was in her usual state of health until 2 days ago when she started feeling weak and had decreased of appetite, developing a fever on the day of arrival with Tmax 102.9.     She was started on Merrem due to hx of ESBL UTI.  Evaluated by PT/OT, declined Home health PT/OT.   10/09/2021:  Seen at bedside this morning, was having trouble swallowing her potassium replacement but denies nausea.  Assessment/Plan: Principal Problem:   Severe recurrent major depression without psychotic features ([place]) Active Problems:   Stage 3b chronic kidney disease (HCC)   Self-catheterizes continent urinary pouch ([place] pouch)   Catheter-associated urinary tract infection (HCC)   Frequent UTI   Coronary artery disease involving native coronary artery   Hypertension, essential   Major depressive disorder   S/P coronary artery bypass graft x 5   History of TIA (transient ischemic attack)   History of bladder cancer s/p cystectectomy with [place] pouch   Uncontrolled type 2 diabetes mellitus with hyperglycemia, with long-term current use of insulin (HCC)   Hypokalemia  Catheter-associated urinary tract infection ([place]) Rocephin dc'd and replaced by Merrem on 2/223 Continue gentle IV hydration LR Kcl40 meq at 75cc/hr x 2 days   Frequent UTIs History of frequent UTIs including ESBL, seen on review of past urology notes History of  prophylactic irrigation with tobramycin/gentamicin q 2 days up until November 2022 when she relocated to [place] from [place] Will need to follow up with urology outpatient   Self-catheterizes continent urinary pouch ([place] pouch) Self-catheterization 4 times daily while awake If acute issues consult urology, none reported at the time of this dictation.  Non anion gap metabolic acidosis in the setting of CKD3B AG 7 and serum CO2 19 Continue IV fluid hydration Repeat BMP AM   Refractory Hypokalemia K+ 2.7  Being repleted intravenously and orally.  Repeat BMP in the AM Magnesium 1.9  Hypophosphatemia Phosphlorous 1.2 Repleted intravenously Repeat level in the AM   History of bladder cancer s/p cystectectomy with [place] pouch S/p [place] pouch in 2004 No acute issues.   Patient has not set up care with a urologist as yet since her relocation to [place] from [place] in 07/2021 Consider inpatient urology outpatient referral   Uncontrolled type 2 diabetes mellitus with hyperglycemia, with long-term current use of insulin ([place]) A1C 7.5 10/08/21 Sliding scale insulin coverage   Stage 3b chronic kidney disease ([place]) Renal function at baseline when compared to labs in Care Everywhere Cr 1.33 with GFR 41   S/P coronary artery bypass graft x 5 Denies any anginal symptoms Continue home meds Continue to monitor with remote telemetry.   Coronary artery disease involving native coronary artery- (present on admission) No complaints of chest pain Denies any anginal symptoms Not on goal-directed medical therapy prior to admission    History of TIA (transient ischemic attack) No aspirin or statin on home medications   Major depressive disorder- (  present on admission) Continue home meds, sertraline Psych consult placed (due to triggered alert)   Severe protein calorie malnutrition/underweight BMI 16 Albumin 3.0 Resume home appetite stimulant Megace Continue to encourage increase  in protein calorie intake     Advance Care Planning:   Code Status: DNR   Consults: None.   Family Communication: Called her daughter x3 no answer, left a voicemail message on 10/09/2021.      Code Status: DNR  DVT prophylaxis: Subcu Lovenox daily.    Disposition Plan: Likely will discharge to home in the next 24 to 48 hours once we have the results of the urine culture and sensitivities.  Patient has declined home health PT OT.     Status is: Inpatient  Patient requires at least 2 midnights for further evaluation and treatment of present condition.      Objective: Vitals:   10/08/21 1531 10/08/21 2031 10/09/21 0448 10/09/21 0934  BP: 115/68 123/60 128/76 129/79  Pulse: 71 85 76 71  Resp: 18 15 17 18   Temp: 98.4 F (36.9 C) 99 F (37.2 C) 99.1 F (37.3 C) 97.8 F (36.6 C)  TempSrc: Oral Oral Oral Oral  SpO2: 97% 99% 99% 97%  Weight:      Height:        Intake/Output Summary (Last 24 hours) at 10/09/2021 1459 Last data filed at 10/09/2021 1351 Gross per 24 hour  Intake 2093.91 ml  Output 3725 ml  Net -1631.09 ml   Filed Weights   10/08/21 0137  Weight: 49.9 kg    Exam:  General: 81 y.o. year-old female frail-appearing no acute distress.  She is alert and oriented x3.   Cardiovascular: Regular rate and rhythm with no rubs or gallops.  No thyromegaly or JVD noted.   Respiratory: Clear to auscultation with no wheezes or rales. Good inspiratory effort. Abdomen: Soft nontender nondistended with normal bowel sounds x4 quadrants. Musculoskeletal: No lower extremity edema. 2/4 pulses in all 4 extremities. Skin: No ulcerative lesions noted or rashes.  Abdominal lumen from self cath. Psychiatry: Mood is appropriate for condition and setting   Data Reviewed: CBC: Recent Labs  Lab 10/07/21 2014 10/08/21 0356 10/09/21 0537  WBC 11.3* 10.0 5.7  NEUTROABS 9.6*  --   --   HGB 11.7* 10.2* 10.2*  HCT 34.7* 30.0* 29.9*  MCV 89.4 89.0 87.9  PLT 156 140* 135*    Basic Metabolic Panel: Recent Labs  Lab 10/07/21 2014 10/08/21 0356 10/08/21 1138 10/09/21 0537  NA 136 136  --  139  K 3.2* 2.7* 3.5 2.7*  CL 111 114*  --  113*  CO2 18* 18*  --  19*  GLUCOSE 247* 208*  --  206*  BUN 53* 45*  --  29*  CREATININE 1.53* 1.45*  --  1.33*  CALCIUM 8.5* 7.8*  --  8.2*  MG  --   --   --  1.9  PHOS  --   --   --  1.2*   GFR: Estimated Creatinine Clearance: 27.5 mL/min (A) (by C-G formula based on SCr of 1.33 mg/dL (H)). Liver Function Tests: Recent Labs  Lab 10/07/21 2014  AST 16  ALT 13  ALKPHOS 68  BILITOT 1.0  PROT 6.6  ALBUMIN 3.2*   No results for input(s): LIPASE, AMYLASE in the last 168 hours. No results for input(s): AMMONIA in the last 168 hours. Coagulation Profile: Recent Labs  Lab 10/07/21 2014  INR 1.2   Cardiac Enzymes: No results for input(s):  CKTOTAL, CKMB, CKMBINDEX, TROPONINI in the last 168 hours. BNP (last 3 results) No results for input(s): PROBNP in the last 8760 hours. HbA1C: Recent Labs    10/08/21 0356  HGBA1C 7.5*   CBG: Recent Labs  Lab 10/08/21 1140 10/08/21 1727 10/08/21 2115 10/09/21 0927 10/09/21 1158  GLUCAP 198* 144* 193* 176* 124*   Lipid Profile: No results for input(s): CHOL, HDL, LDLCALC, TRIG, CHOLHDL, LDLDIRECT in the last 72 hours. Thyroid Function Tests: No results for input(s): TSH, T4TOTAL, FREET4, T3FREE, THYROIDAB in the last 72 hours. Anemia Panel: No results for input(s): VITAMINB12, FOLATE, FERRITIN, TIBC, IRON, RETICCTPCT in the last 72 hours. Urine analysis:    Component Value Date/Time   COLORURINE YELLOW 10/07/2021 2201   APPEARANCEUR CLEAR 10/07/2021 2201   LABSPEC 1.015 10/07/2021 2201   PHURINE 8.5 (H) 10/07/2021 2201   GLUCOSEU 100 (A) 10/07/2021 2201   HGBUR TRACE (A) 10/07/2021 2201   BILIRUBINUR NEGATIVE 10/07/2021 2201   KETONESUR NEGATIVE 10/07/2021 2201   PROTEINUR 100 (A) 10/07/2021 2201   NITRITE NEGATIVE 10/07/2021 2201   LEUKOCYTESUR SMALL (A)  10/07/2021 2201   Sepsis Labs: @LABRCNTIP (procalcitonin:4,lacticidven:4)  ) Recent Results (from the past 240 hour(s))  Resp Panel by RT-PCR (Flu A&B, Covid) Nasopharyngeal Swab     Status: None   Collection Time: 10/07/21  7:35 PM   Specimen: Nasopharyngeal Swab; Nasopharyngeal(NP) swabs in vial transport medium  Result Value Ref Range Status   SARS Coronavirus 2 by RT PCR NEGATIVE NEGATIVE Final    Comment: (NOTE) SARS-CoV-2 target nucleic acids are NOT DETECTED.  The SARS-CoV-2 RNA is generally detectable in upper respiratory specimens during the acute phase of infection. The lowest concentration of SARS-CoV-2 viral copies this assay can detect is 138 copies/mL. A negative result does not preclude SARS-Cov-2 infection and should not be used as the sole basis for treatment or other patient management decisions. A negative result may occur with  improper specimen collection/handling, submission of specimen other than nasopharyngeal swab, presence of viral mutation(s) within the areas targeted by this assay, and inadequate number of viral copies(<138 copies/mL). A negative result must be combined with clinical observations, patient history, and epidemiological information. The expected result is Negative.  Fact Sheet for Patients:  EntrepreneurPulse.com.au  Fact Sheet for Healthcare Providers:  IncredibleEmployment.be  This test is no t yet approved or cleared by the [place] FDA and  has been authorized for detection and/or diagnosis of SARS-CoV-2 by FDA under an Emergency Use Authorization (EUA). This EUA will remain  in effect (meaning this test can be used) for the duration of the COVID-19 declaration under Section 564(b)(1) of the Act, 21 U.S.C.section 360bbb-3(b)(1), unless the authorization is terminated  or revoked sooner.       Influenza A by PCR NEGATIVE NEGATIVE Final   Influenza B by PCR NEGATIVE NEGATIVE Final     Comment: (NOTE) The Xpert Xpress SARS-CoV-2/FLU/RSV plus assay is intended as an aid in the diagnosis of influenza from Nasopharyngeal swab specimens and should not be used as a sole basis for treatment. Nasal washings and aspirates are unacceptable for Xpert Xpress SARS-CoV-2/FLU/RSV testing.  Fact Sheet for Patients: EntrepreneurPulse.com.au  Fact Sheet for Healthcare Providers: IncredibleEmployment.be  This test is not yet approved or cleared by the [place] FDA and has been authorized for detection and/or diagnosis of SARS-CoV-2 by FDA under an Emergency Use Authorization (EUA). This EUA will remain in effect (meaning this test can be used) for the duration of the COVID-19 declaration under  Section 564(b)(1) of the Act, 21 U.S.C. section 360bbb-3(b)(1), unless the authorization is terminated or revoked.  Performed at [facility] : [facility], [address] [place], [place] [address]   Blood Culture (routine x 2)     Status: None (Preliminary result)   Collection Time: 10/07/21  7:35 PM   Specimen: BLOOD  Result Value Ref Range Status   Specimen Description BLOOD RIGHT ANTECUBITAL  Final   Special Requests   Final    BOTTLES DRAWN AEROBIC AND ANAEROBIC Blood Culture results may not be optimal due to an excessive volume of blood received in culture bottles   Culture   Final    NO GROWTH 2 DAYS Performed at [facility], [address] [place], [place] [address]    Report Status PENDING  Incomplete  Urine Culture     Status: Abnormal (Preliminary result)   Collection Time: 10/07/21 10:01 PM   Specimen: Urine, Random  Result Value Ref Range Status   Specimen Description   Final    URINE, RANDOM Performed at [facility], [address], [place], [place] [address]    Special Requests   Final    NONE Performed at [facility], [address], [place], [place] [address]    Culture (A)  Final    10,000  COLONIES/mL ESCHERICHIA COLI SUSCEPTIBILITIES TO FOLLOW Performed at [facility], [place] [address] [place], [place] [address]    Report Status PENDING  Incomplete  Blood Culture (routine x 2)     Status: None (Preliminary result)   Collection Time: 10/07/21 10:02 PM   Specimen: BLOOD  Result Value Ref Range Status   Specimen Description BLOOD RIGHT FOREARM  Final   Special Requests   Final    BOTTLES DRAWN AEROBIC AND ANAEROBIC Blood Culture results may not be optimal due to an excessive volume of blood received in culture bottles   Culture   Final    NO GROWTH 2 DAYS Performed at [facility], [address] [place], [place] [address]    Report Status PENDING  Incomplete      Studies: IR CV Line Injection  Result Date: 10/09/2021 INDICATION: Port malfunction EXAM: Port check using fluoroscopy MEDICATIONS: None ANESTHESIA/SEDATION: None FLUOROSCOPY TIME:  Fluoroscopy Time: 0.5 minutes (2 mGy) COMPLICATIONS: None immediate. PROCEDURE: The patient was placed supine on the exam table. The port was accessed prior to arrival. The port was found to flush appropriately, but could not aspirate blood return. Initial fluoroscopic evaluation demonstrates a right chest port which appears to enter via the right internal jugular vein. The catheter tip terminates in the SVC. The catheter appears to course normally without kink or discontinuity. Contrast injection of the port was performed. This demonstrates a fibrin sheath that is flush with the tip of the catheter. There is reflux of contrast material around the fibrin sheath with contrast material entering the right brachiocephalic vein, and then flowing centrally into the SVC. The port was saline locked at the end of the procedure. IMPRESSION: 1. Evaluation of the right chest port demonstrates large fibrin sheath. If appropriate, the patient can be scheduled for port revision with interventional radiology. 2. The port, as it is currently  access, would be safe to use for IV medication administration. Electronically Signed   By: Albin Felling M.D.   On: 10/09/2021 12:53    Scheduled Meds:  Chlorhexidine Gluconate Cloth  6 each Topical Daily   enoxaparin (LOVENOX) injection  30 mg Subcutaneous Q24H  escitalopram  10 mg Oral Daily   gabapentin  300 mg Oral BID   insulin aspart  0-15 Units Subcutaneous TID WC   insulin aspart  0-5 Units Subcutaneous QHS   megestrol  40 mg Oral Daily    Continuous Infusions:  lactated ringers with kcl 75 mL/hr at 10/09/21 1020   meropenem (MERREM) IV Stopped (10/09/21 7948)   potassium PHOSPHATE IVPB (in mmol) 30 mmol (10/09/21 1016)     LOS: 2 days     Kayleen Memos, MD [facility] Pager [phone]  If 7PM-7AM, please contact night-coverage [email or web address] Password [facility] 10/09/2021, 2:59 PM

## 2021-10-09 NOTE — Progress Notes (Signed)
PT Cancellation Note  Patient Details Name: Kari Woods MRN: [id number] DOB: [birth year]   Cancelled Treatment:    Reason Eval/Treat Not Completed: Patient not medically ready PT orders received, chart reviewed. Pt noted to have critically low K+ (2.7) with MD to requesting to hold PT intervention today. Will f/u as able & as pt I medically stable.  Lavone Nian, PT, DPT 10/09/21, 10:41 AM   Waunita Schooner 10/09/2021, 10:40 AM

## 2021-10-09 NOTE — Progress Notes (Signed)
Called the patient's daughter Santiago Glad [phone] to give updates.  No answer, left a voicemail message.

## 2021-10-09 NOTE — Care Management Important Message (Signed)
Important Message  Patient Details  Name: Kari Woods MRN: [id number] Date of Birth: [birth year]   Medicare Important Message Given:  Yes  Reviewed Medicare IM with patient via room phone due to isolation status.  Copy of Medicare IM to be delivered to patient's room via nursing staff.    Dannette Barbara 10/09/2021, 4:14 PM

## 2021-10-09 NOTE — Consult Note (Signed)
[facility] Consult   Reason for Consult: Follow-up for patient with depression seen yesterday in consult Referring Physician: [place] Crane Patient Identification: Kari Woods MRN:  [id number] Principal Diagnosis: Severe recurrent major depression without psychotic features ([place]) Diagnosis:  Principal Problem:   Severe recurrent major depression without psychotic features ([place]) Active Problems:   Stage 3b chronic kidney disease ([place])   Self-catheterizes continent urinary pouch ([place] pouch)   Catheter-associated urinary tract infection ([place])   Frequent UTI   Coronary artery disease involving native coronary artery   Hypertension, essential   Major depressive disorder   S/P coronary artery bypass graft x 5   History of TIA (transient ischemic attack)   History of bladder cancer s/p cystectectomy with [place] pouch   Uncontrolled type 2 diabetes mellitus with hyperglycemia, with long-term current use of insulin (HCC)   Hypokalemia   Total Time spent with patient: 20 minutes  Subjective:   Kari Woods is a 81 y.o. female patient admitted with "I am okay".  HPI: See note from yesterday.  81 year old woman with a history of depression currently in the hospital with multiple medical problems.  Patient reports no significant change in her overall wellbeing today.  Still has passive wishes that she would be dead but with no suicidal intent at all.  Lucid no sign of psychosis.  I received a message this morning that the patient's daughter called and said she had been on Lexapro in the past.  Patient confirmed this.  Daughter had also mentioned that the patient had been on Remeron in the past.  Patient had no memory of this.  Past Psychiatric History: History of depression unknown whether any medication had ever worked but we are told that at least Lexapro and Zoloft did not  Risk to Self:   Risk to Others:   Prior Inpatient Therapy:   Prior Outpatient Therapy:    Past  Medical History:  Past Medical History:  Diagnosis Date   Cancer [facility])    Bladder cancer 2006, lymphoplasmatic lymphoma 2016, breast cancer 2017. 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 No pertinent family history. Family Psychiatric  History: See previous Social History:  Social History   Substance and Sexual Activity  Alcohol Use Not Currently     Social History   Substance and Sexual Activity  Drug Use Not Currently    Social History   Socioeconomic History   Marital status: Widowed    Spouse name: Not on file   Number of children: Not on file   Years of education: Not on file   Highest education level: Not on file  Occupational History   Not on file  Tobacco Use   Smoking status: Former    Types: Cigarettes   Smokeless tobacco: Never  Vaping Use   Vaping Use: Never used  Substance and Sexual Activity   Alcohol use: Not Currently   Drug use: Not Currently   Sexual activity: Not on file  Other Topics Concern   Not on file  Social History Narrative   Not on file   Social Determinants of Health  Financial Resource Strain: Not on file  Food Insecurity: Not on file  Transportation Needs: Not on file  Physical Activity: Not on file  Stress: Not on file  Social Connections: Not on file   Additional Social History:    Allergies:   Allergies  Allergen Reactions   Bee Venom Swelling and Other (See Comments)    Other reaction(s): Unknown Other reaction(s): Swelling/Edema Severity: Swelling/Edema - Moderate; Type: Animal;  Other reaction(s): Swelling/Edema Severity: Swelling/Edema - Moderate; Type: Animal;      Labs:   Results for orders placed or performed during the hospital encounter of 10/07/21 (from the past 48 hour(s))  Resp Panel by RT-PCR (Flu A&B, Covid) Nasopharyngeal Swab     Status: None   Collection Time: 10/07/21  7:35 PM   Specimen: Nasopharyngeal Swab; Nasopharyngeal(NP) swabs in vial transport medium  Result Value Ref Range   SARS Coronavirus 2 by RT PCR NEGATIVE NEGATIVE    Comment: (NOTE) SARS-CoV-2 target nucleic acids are NOT DETECTED.  The SARS-CoV-2 RNA is generally detectable in upper respiratory specimens during the acute phase of infection. The lowest concentration of SARS-CoV-2 viral copies this assay can detect is 138 copies/mL. A negative result does not preclude SARS-Cov-2 infection and should not be used as the sole basis for treatment or other patient management decisions. A negative result may occur with  improper specimen collection/handling, submission of specimen other than nasopharyngeal swab, presence of viral mutation(s) within the areas targeted by this assay, and inadequate number of viral copies(<138 copies/mL). A negative result must be combined with clinical observations, patient history, and epidemiological information. The expected result is Negative.  Fact Sheet for Patients:  EntrepreneurPulse.com.au  Fact Sheet for Healthcare Providers:  IncredibleEmployment.be  This test is no t yet approved or cleared by the [place] FDA and  has been authorized for detection and/or diagnosis of SARS-CoV-2 by FDA under an Emergency Use Authorization (EUA). This EUA will remain  in effect (meaning this test can be used) for the duration of the COVID-19 declaration under Section 564(b)(1) of the Act, 21 U.S.C.section 360bbb-3(b)(1), unless the authorization is terminated  or revoked sooner.       Influenza A by PCR NEGATIVE NEGATIVE   Influenza B by PCR NEGATIVE NEGATIVE    Comment: (NOTE) The Xpert Xpress  SARS-CoV-2/FLU/RSV plus assay is intended as an aid in the diagnosis of influenza from Nasopharyngeal swab specimens and should not be used as a sole basis for treatment. Nasal washings and aspirates are unacceptable for Xpert Xpress SARS-CoV-2/FLU/RSV testing.  Fact Sheet for Patients: EntrepreneurPulse.com.au  Fact Sheet for Healthcare Providers: IncredibleEmployment.be  This test is not yet approved or cleared by the [place] FDA and has been authorized for detection and/or diagnosis of SARS-CoV-2 by FDA under an Emergency Use Authorization (EUA). This EUA will remain in effect (meaning this test can be used) for the duration of the COVID-19 declaration under Section 564(b)(1) of the Act, 21 U.S.C. section 360bbb-3(b)(1), unless the authorization is terminated or revoked.  Performed at [facility], [address] [place],  [address]   Blood Culture (routine x 2)     Status: None (Preliminary result)   Collection Time: 10/07/21  7:35 PM   Specimen: BLOOD  Result Value Ref Range   Specimen Description BLOOD RIGHT ANTECUBITAL    Special Requests      BOTTLES DRAWN AEROBIC AND ANAEROBIC Blood Culture results may not be optimal due to an excessive volume of blood received in culture  bottles   Culture      NO GROWTH 2 DAYS Performed at [facility], [address] [place], [place] [address]    Report Status PENDING   Lactic acid, plasma     Status: None   Collection Time: 10/07/21  8:14 PM  Result Value Ref Range   Lactic Acid, Venous 1.0 0.5 - 1.9 mmol/L    Comment: Performed at [facility], [address] [place], [place] [address]  Comprehensive metabolic panel     Status: Abnormal   Collection Time: 10/07/21  8:14 PM  Result Value Ref Range   Sodium 136 135 - 145 mmol/L   Potassium 3.2 (L) 3.5 - 5.1 mmol/L    Comment: HEMOLYSIS AT THIS LEVEL MAY AFFECT RESULT   Chloride 111 98 - 111 mmol/L    CO2 18 (L) 22 - 32 mmol/L   Glucose, Bld 247 (H) 70 - 99 mg/dL    Comment: Glucose reference range applies only to samples taken after fasting for at least 8 hours.   BUN 53 (H) 8 - 23 mg/dL   Creatinine, Ser 1.53 (H) 0.44 - 1.00 mg/dL   Calcium 8.5 (L) 8.9 - 10.3 mg/dL   Total Protein 6.6 6.5 - 8.1 g/dL   Albumin 3.2 (L) 3.5 - 5.0 g/dL   AST 16 15 - 41 U/L   ALT 13 0 - 44 U/L   Alkaline Phosphatase 68 38 - 126 U/L   Total Bilirubin 1.0 0.3 - 1.2 mg/dL   GFR, Estimated 35 (L) >60 mL/min    Comment: (NOTE) Calculated using the CKD-EPI Creatinine Equation (2021)    Anion gap 7 5 - 15    Comment: Performed at [facility], [address] [place], [place] [address]  CBC WITH DIFFERENTIAL     Status: Abnormal   Collection Time: 10/07/21  8:14 PM  Result Value Ref Range   WBC 11.3 (H) 4.0 - 10.5 K/uL   RBC 3.88 3.87 - 5.11 MIL/uL   Hemoglobin 11.7 (L) 12.0 - 15.0 g/dL   HCT 34.7 (L) 36.0 - 46.0 %   MCV 89.4 80.0 - 100.0 fL   MCH 30.2 26.0 - 34.0 pg   MCHC 33.7 30.0 - 36.0 g/dL   RDW 13.5 11.5 - 15.5 %   Platelets 156 150 - 400 K/uL   nRBC 0.0 0.0 - 0.2 %   Neutrophils Relative % 85 %   Neutro Abs 9.6 (H) 1.7 - 7.7 K/uL   Lymphocytes Relative 5 %   Lymphs Abs 0.6 (L) 0.7 - 4.0 K/uL   Monocytes Relative 9 %   Monocytes Absolute 1.1 (H) 0.1 - 1.0 K/uL   Eosinophils Relative 0 %   Eosinophils Absolute 0.0 0.0 - 0.5 K/uL   Basophils Relative 0 %   Basophils Absolute 0.0 0.0 - 0.1 K/uL   Immature Granulocytes 1 %   Abs Immature Granulocytes 0.07 0.00 - 0.07 K/uL    Comment: Performed at [facility], [address] [place], [place] [address]  Protime-INR     Status: Abnormal   Collection Time: 10/07/21  8:14 PM  Result Value Ref Range   Prothrombin Time 15.4 (H) 11.4 - 15.2 seconds   INR 1.2 0.8 - 1.2    Comment: (NOTE) INR goal varies based on device and disease states. Performed at [facility], [address] [place], [place] [address]    APTT     Status: None   Collection Time: 10/07/21  8:14 PM  Result Value Ref Range   aPTT 32 24 - 36 seconds    Comment: Performed at [facility], [address] [place], [place] [address]  Lactic acid, plasma     Status: None   Collection Time: 10/07/21 10:01 PM  Result Value Ref Range   Lactic Acid, Venous 1.1 0.5 - 1.9 mmol/L    Comment: Performed at [facility], [address] [place], [place] [address]  Urinalysis, Complete w Microscopic Urine, Suprapubic     Status: Abnormal   Collection Time: 10/07/21 10:01 PM  Result Value Ref Range   Color, Urine YELLOW YELLOW   APPearance CLEAR CLEAR   Specific Gravity, Urine 1.015 1.005 - 1.030   pH 8.5 (H) 5.0 - 8.0   Glucose, UA 100 (A) NEGATIVE mg/dL   Hgb urine dipstick TRACE (A) NEGATIVE   Bilirubin Urine NEGATIVE NEGATIVE   Ketones, ur NEGATIVE NEGATIVE mg/dL   Protein, ur 100 (A) NEGATIVE mg/dL   Nitrite NEGATIVE NEGATIVE   Leukocytes,Ua SMALL (A) NEGATIVE   Squamous Epithelial / LPF 0-5 0 - 5   WBC, UA 11-20 0 - 5 WBC/hpf   RBC / HPF 0-5 0 - 5 RBC/hpf   Bacteria, UA MANY (A) NONE SEEN    Comment: Performed at [facility], [address], [place], [place] [address]  Urine Culture     Status: Abnormal (Preliminary result)   Collection Time: 10/07/21 10:01 PM   Specimen: Urine, Random  Result Value Ref Range   Specimen Description      URINE, RANDOM Performed at [facility], [address] [place], [place] [address]    Special Requests      NONE Performed at [facility], [place], [place] [address]    Culture (A)     10,000 COLONIES/mL ESCHERICHIA COLI SUSCEPTIBILITIES TO FOLLOW Performed at [facility], [place] [address], [place], [place] [address]    Report Status PENDING   Blood Culture (routine x 2)     Status: None (Preliminary result)   Collection Time: 10/07/21 10:02 PM   Specimen: BLOOD  Result Value Ref Range   Specimen  Description BLOOD RIGHT FOREARM    Special Requests      BOTTLES DRAWN AEROBIC AND ANAEROBIC Blood Culture results may not be optimal due to an excessive volume of blood received in culture bottles   Culture      NO GROWTH 2 DAYS Performed at [facility], [address] [place], [place] [address]    Report Status PENDING   Glucose, capillary     Status: Abnormal   Collection Time: 10/08/21  1:24 AM  Result Value Ref Range   Glucose-Capillary 239 (H) 70 - 99 mg/dL    Comment: Glucose reference range applies only to samples taken after fasting for at least 8 hours.  Hemoglobin A1c     Status: Abnormal   Collection Time: 10/08/21  3:56 AM  Result Value Ref Range   Hgb A1c MFr Bld 7.5 (H) 4.8 - 5.6 %    Comment: (NOTE)         Prediabetes: 5.7 - 6.4         Diabetes: >6.4         Glycemic control for adults with diabetes: <7.0    Mean Plasma Glucose 169 mg/dL    Comment: (NOTE) Performed At: [facility] [address] [place], [place] [id number] Rush Farmer MD MP:[id number]  Basic metabolic panel     Status: Abnormal   Collection Time: 10/08/21  3:56 AM  Result Value Ref Range   Sodium 136 135 - 145 mmol/L   Potassium 2.7 (LL) 3.5 - 5.1 mmol/L    Comment: CRITICAL RESULT CALLED TO, READ BACK BY AND VERIFIED WITH Doylene Canning RN [phone] 10/08/21 HNM    Chloride 114 (H) 98 - 111 mmol/L   CO2 18 (L) 22 - 32 mmol/L   Glucose, Bld 208 (H) 70 - 99 mg/dL    Comment: Glucose reference range applies only to samples taken after fasting for at least 8 hours.   BUN 45 (H) 8 - 23 mg/dL   Creatinine, Ser 1.45 (H) 0.44 - 1.00 mg/dL   Calcium 7.8 (L) 8.9 - 10.3 mg/dL   GFR, Estimated 37 (L) >60 mL/min    Comment: (NOTE) Calculated using the CKD-EPI Creatinine Equation (2021)    Anion gap 4 (L) 5 - 15    Comment: Performed at [facility] [address] [place], [place] [address]  CBC     Status: Abnormal   Collection Time: 10/08/21  3:56 AM  Result Value  Ref Range   WBC 10.0 4.0 - 10.5 K/uL   RBC 3.37 (L) 3.87 - 5.11 MIL/uL   Hemoglobin 10.2 (L) 12.0 - 15.0 g/dL   HCT 30.0 (L) 36.0 - 46.0 %   MCV 89.0 80.0 - 100.0 fL   MCH 30.3 26.0 - 34.0 pg   MCHC 34.0 30.0 - 36.0 g/dL   RDW 13.3 11.5 - 15.5 %   Platelets 140 (L) 150 - 400 K/uL   nRBC 0.0 0.0 - 0.2 %    Comment: Performed at [facility], [address] [place], [place] [address]  Glucose, capillary     Status: Abnormal   Collection Time: 10/08/21  8:48 AM  Result Value Ref Range   Glucose-Capillary 155 (H) 70 - 99 mg/dL    Comment: Glucose reference range applies only to samples taken after fasting for at least 8 hours.   Comment 1 Notify RN    Comment 2 Document in Chart   Potassium     Status: None   Collection Time: 10/08/21 11:38 AM  Result Value Ref Range   Potassium 3.5 3.5 - 5.1 mmol/L    Comment: Performed at [facility], [address] [place], [place] [address]  Glucose, capillary     Status: Abnormal   Collection Time: 10/08/21 11:40 AM  Result Value Ref Range   Glucose-Capillary 198 (H) 70 - 99 mg/dL    Comment: Glucose reference range applies only to samples taken after fasting for at least 8 hours.  Glucose, capillary     Status: Abnormal   Collection Time: 10/08/21  5:27 PM  Result Value Ref Range   Glucose-Capillary 144 (H) 70 - 99 mg/dL    Comment: Glucose reference range applies only to samples taken after fasting for at least 8 hours.  Glucose, capillary     Status: Abnormal   Collection Time: 10/08/21  9:15 PM  Result Value Ref Range   Glucose-Capillary 193 (H) 70 - 99 mg/dL    Comment: Glucose reference range applies only to samples taken after fasting for at least 8 hours.  CBC     Status: Abnormal   Collection Time: 10/09/21  5:37 AM  Result Value Ref Range   WBC 5.7 4.0 - 10.5 K/uL   RBC 3.40 (L) 3.87 - 5.11 MIL/uL  Hemoglobin 10.2 (L) 12.0 - 15.0 g/dL   HCT 29.9 (L) 36.0 - 46.0 %   MCV 87.9 80.0 - 100.0 fL   MCH 30.0  26.0 - 34.0 pg   MCHC 34.1 30.0 - 36.0 g/dL   RDW 13.3 11.5 - 15.5 %   Platelets 135 (L) 150 - 400 K/uL   nRBC 0.0 0.0 - 0.2 %    Comment: Performed at [facility], [address], [place], [place] [address]  Basic metabolic panel     Status: Abnormal   Collection Time: 10/09/21  5:37 AM  Result Value Ref Range   Sodium 139 135 - 145 mmol/L   Potassium 2.7 (LL) 3.5 - 5.1 mmol/L    Comment: CRITICAL RESULT CALLED TO, READ BACK BY AND VERIFIED WITH JODIE SMITH@[email or web address]  10/09/21 RH    Chloride 113 (H) 98 - 111 mmol/L   CO2 19 (L) 22 - 32 mmol/L   Glucose, Bld 206 (H) 70 - 99 mg/dL    Comment: Glucose reference range applies only to samples taken after fasting for at least 8 hours.   BUN 29 (H) 8 - 23 mg/dL   Creatinine, Ser 1.33 (H) 0.44 - 1.00 mg/dL   Calcium 8.2 (L) 8.9 - 10.3 mg/dL   GFR, Estimated 41 (L) >60 mL/min    Comment: (NOTE) Calculated using the CKD-EPI Creatinine Equation (2021)    Anion gap 7 5 - 15    Comment: Performed at [facility], [address] [place], [place] [address]  Magnesium     Status: None   Collection Time: 10/09/21  5:37 AM  Result Value Ref Range   Magnesium 1.9 1.7 - 2.4 mg/dL    Comment: Performed at [facility], [address] [place], [place] [address]  Phosphorus     Status: Abnormal   Collection Time: 10/09/21  5:37 AM  Result Value Ref Range   Phosphorus 1.2 (L) 2.5 - 4.6 mg/dL    Comment: Performed at [facility], [address] [place], [place] [address]  Glucose, capillary     Status: Abnormal   Collection Time: 10/09/21  9:27 AM  Result Value Ref Range   Glucose-Capillary 176 (H) 70 - 99 mg/dL    Comment: Glucose reference range applies only to samples taken after fasting for at least 8 hours.  Glucose, capillary     Status: Abnormal   Collection Time: 10/09/21 11:58 AM  Result Value Ref Range   Glucose-Capillary 124 (H) 70 - 99 mg/dL    Comment: Glucose reference range applies only to  samples taken after fasting for at least 8 hours.  Glucose, capillary     Status: Abnormal   Collection Time: 10/09/21  4:39 PM  Result Value Ref Range   Glucose-Capillary 164 (H) 70 - 99 mg/dL    Comment: Glucose reference range applies only to samples taken after fasting for at least 8 hours.    Current Facility-Administered Medications  Medication Dose Route Frequency Provider Last Rate Last Admin   acetaminophen (TYLENOL) tablet 650 mg  650 mg Oral Q6H PRN Athena Masse, MD   650 mg at 10/08/21 0018   Or   acetaminophen (TYLENOL) suppository 650 mg  650 mg Rectal Q6H PRN Athena Masse, MD       Chlorhexidine Gluconate Cloth 2 % PADS 6 each  6 each Topical Daily Irene Pap N, DO   6 each at 10/09/21 1727   enoxaparin (LOVENOX) injection 30 mg  30 mg Subcutaneous Q24H Judd Gaudier V, MD   30 mg at 10/09/21 4235   escitalopram (LEXAPRO) tablet 10 mg  10 mg Oral Daily Tauriel Scronce, Madie Reno, MD   10 mg at 10/09/21 0901   gabapentin (NEURONTIN) capsule 300 mg  300 mg Oral BID Kayleen Memos, DO   300 mg at 10/09/21 1239   insulin aspart (novoLOG) injection 0-15 Units  0-15 Units Subcutaneous TID WC Athena Masse, MD   2 Units at 10/09/21 1727   insulin aspart (novoLOG) injection 0-5 Units  0-5 Units Subcutaneous QHS Athena Masse, MD   2 Units at 10/08/21 0157   lactated ringers 1,000 mL with potassium chloride 40 mEq infusion   Intravenous Continuous Kayleen Memos, DO 75 mL/hr at 10/09/21 1020 Infusion Verify at 10/09/21 1020   megestrol (MEGACE) tablet 40 mg  40 mg Oral Daily Irene Pap N, DO   40 mg at 10/09/21 1329   meropenem (MERREM) 1 g in sodium chloride 0.9 % 100 mL IVPB  1 g Intravenous Q12H Irene Pap N, DO   Stopped at 10/09/21 3614   ondansetron (ZOFRAN) tablet 4 mg  4 mg Oral Q6H PRN Athena Masse, MD       Or   ondansetron [facility]) injection 4 mg  4 mg Intravenous Q6H PRN Athena Masse, MD   4 mg at 10/09/21 0818   potassium PHOSPHATE 30 mmol in dextrose 5 % 500 mL  infusion  30 mmol Intravenous BID Kayleen Memos, DO 85 mL/hr at 10/09/21 1016 30 mmol at 10/09/21 1016    Musculoskeletal: Strength & Muscle Tone: within normal limits Gait & Station: unable to stand Patient leans: N/A            Psychiatric Specialty Exam:  Presentation  General Appearance: No data recorded Eye Contact:No data recorded Speech:No data recorded Speech Volume:No data recorded Handedness:No data recorded  Mood and Affect  Mood:No data recorded Affect:No data recorded  Thought Process  Thought Processes:No data recorded Descriptions of Associations:No data recorded Orientation:No data recorded Thought Content:No data recorded History of Schizophrenia/Schizoaffective disorder:No data recorded Duration of Psychotic Symptoms:No data recorded Hallucinations:No data recorded Ideas of Reference:No data recorded Suicidal Thoughts:No data recorded Homicidal Thoughts:No data recorded  Sensorium  Memory:No data recorded Judgment:No data recorded Insight:No data recorded  Executive Functions  Concentration:No data recorded Attention Span:No data recorded Recall:No data recorded Fund of Knowledge:No data recorded Language:No data recorded  Psychomotor Activity  Psychomotor Activity:No data recorded  Assets  Assets:No data recorded  Sleep  Sleep:No data recorded  Physical Exam: Physical Exam Vitals and nursing note reviewed.  Constitutional:      Appearance: She is ill-appearing.  HENT:     Head: Normocephalic and atraumatic.     Mouth/Throat:     Pharynx: Oropharynx is clear.  Eyes:     Pupils: Pupils are equal, round, and reactive to light.  Cardiovascular:     Rate and Rhythm: Normal rate and regular rhythm.  Pulmonary:     Effort: Pulmonary effort is normal.     Breath sounds: Normal breath sounds.  Abdominal:     General: Abdomen is flat.     Palpations: Abdomen is soft.  Musculoskeletal:        General: Normal range of motion.   Skin:    General: Skin is warm and dry.  Neurological:     General: No focal deficit present.     Mental Status: She is alert. Mental  status is at baseline.  Psychiatric:        Attention and Perception: Attention normal.        Mood and Affect: Mood is depressed.        Speech: Speech normal.        Behavior: Behavior is cooperative.        Thought Content: Thought content includes suicidal ideation. Thought content does not include suicidal plan.   Review of Systems  Constitutional:  Positive for malaise/fatigue and weight loss.  HENT: Negative.    Eyes: Negative.   Respiratory: Negative.    Cardiovascular: Negative.   Gastrointestinal: Negative.   Musculoskeletal: Negative.   Skin: Negative.   Neurological: Negative.   Psychiatric/Behavioral:  Positive for depression. Negative for suicidal ideas.   Blood pressure 110/69, pulse 73, temperature 98.6 F (37 C), resp. rate 18, height 5\' 8"  (1.727 m), weight 49.9 kg, SpO2 96 %. Body mass index is 16.73 kg/m.  Treatment Plan Summary: Medication management and Plan propose to patient that in light of this new information we change her antidepressant again this time perhaps trying bupropion.  Explained that this medicine tends to be stimulating.  Will only be given in the morning and will be started at a low dose.  Patient expressed understanding and agreement.  Disposition: Patient does not meet criteria for psychiatric inpatient admission. Supportive therapy provided about ongoing stressors. Discussed crisis plan, support from social network, calling 911, coming to the Emergency Department, and calling Suicide Hotline.  Alethia Berthold, MD 10/09/2021 5:48 PM

## 2021-10-09 NOTE — Progress Notes (Signed)
Occupational Therapy Treatment Patient Details Name: Kari Woods MRN: [id number] DOB: [birth year] Today's Date: 10/09/2021   History of present illness HPI: Kari Woods is a 81 y.o. female with medical history significant of DM, HTN, HLD, CKD 3B, TIA, CAD s/p CABG, depression, bladder cancer s/p cystectomy with [place] pouch who self catheterizes and with history of frequent UTIs including ESBL, with prior tobramycin irrigations every 2 days, until her relocation to [place] in November 2022 from [place] comments  Pt reported feeling weak, but was cooperative with encouragement to amb to bathroom as pt reported feeling like she could have a BM.  Supv for all transfers and functional mobility with RW.  Set up/supv for ADL completion, see note.  Pt was able to successfully have BM with standard height toilet in bathroom, use of grab bar for slow descent.  1 short standing rest break before hand hygiene, then assisted pt to amb back to bed.  Pt reported feeling glad she had gotten up despite feeling week.  Bed alarm set and reminded pt to use call for assist out of bed d/t weakness.  Pt will continue to benefit from skilled OT to progress ADLs and functional mobility while preventing further functional decline while hospitalized.     Recommendations for follow up therapy are one component of a multi-disciplinary discharge planning process, led by the attending physician.  Recommendations may be updated based on patient status, additional functional criteria and insurance authorization.    Follow Up Recommendations  Home health OT    Assistance Recommended at Discharge Set up Supervision/Assistance  Patient can return home with the following  Assistance with cooking/housework;Help with stairs or ramp for entrance   Equipment Recommendations  BSC/3in1    Recommendations for Other Services      Precautions / Restrictions Precautions Precautions: Fall Restrictions Weight  Bearing Restrictions: No       Mobility Bed Mobility Overal bed mobility: Modified Independent               Patient Response: Cooperative  Transfers Overall transfer level: Needs assistance Equipment used: Rolling walker (2 wheels) Transfers: Sit to/from Stand Sit to Stand: Supervision           General transfer comment: effortful to descent to normal height commode, but able to perform with supv and extra time.     Balance Overall balance assessment: No apparent balance deficits (not formally assessed)                                         ADL either performed or assessed with clinical judgement   ADL Overall ADL's : Needs assistance/impaired     Grooming: Wash/dry hands;Standing;Supervision/safety Grooming Details (indicate cue type and reason): stood at bathroom sink for hand hygiene following toileting, RW beside sink             Lower Body Dressing: Supervision/safety;Sitting/lateral leans Lower Body Dressing Details (indicate cue type and reason): donned grip socks EOB crossing legs with good maintenance of sitting balance Toilet Transfer: Supervision/safety;Grab bars;Rolling walker (2 wheels) Toilet Transfer Details (indicate cue type and reason): standard toilet height in bathroom Toileting- Clothing Manipulation and Hygiene: Supervision/safety       Functional mobility during ADLs: Rolling walker (2 wheels);Supervision/safety General ADL Comments: close supervision d/t pt reported feeling quite weak; pt took 1 standing rest break after toileting before hand hygiene  Extremity/Trunk Assessment Upper Extremity Assessment Upper Extremity Assessment: Generalized weakness   Lower Extremity Assessment Lower Extremity Assessment: Generalized weakness        Vision Patient Visual Report: No change from baseline                Cognition Arousal/Alertness: Awake/alert Behavior During Therapy: WFL for tasks  assessed/performed Overall Cognitive Status: Within Functional Limits for tasks assessed                                                             Pertinent Vitals/ Pain       Pain Assessment Pain Assessment: No/denies pain                                                          Frequency  Min 2X/week        Progress Toward Goals  OT Goals(current goals can now be found in the care plan section)  Progress towards OT goals: Progressing toward goals  Acute Rehab OT Goals Patient Stated Goal: to go home OT Goal Formulation: With patient Time For Goal Achievement: 10/22/21 Potential to Achieve Goals: Good  Plan Discharge plan remains appropriate                     AM-PAC OT "6 Clicks" Daily Activity     Outcome Measure   Help from another person eating meals?: None Help from another person taking care of personal grooming?: A Little Help from another person toileting, which includes using toliet, bedpan, or urinal?: A Little Help from another person bathing (including washing, rinsing, drying)?: A Little Help from another person to put on and taking off regular upper body clothing?: None Help from another person to put on and taking off regular lower body clothing?: A Little 6 Click Score: 20    End of Session Equipment Utilized During Treatment: Rolling walker (2 wheels)  OT Visit Diagnosis: Muscle weakness (generalized) (M62.81)   Activity Tolerance Patient tolerated treatment well   Patient Left in bed;with call bell/phone within reach;with bed alarm set             Time: 7829-5621 OT Time Calculation (min): 17 min  Charges: OT General Charges $OT Visit: 1 Visit OT Treatments $Self Care/Home Management : 8-22 mins  Leta Speller, MS, OTR/L   Darleene Cleaver 10/09/2021, 12:06 PM

## 2021-10-10 ENCOUNTER — Inpatient Hospital Stay: Payer: Medicare (Managed Care)

## 2021-10-10 DIAGNOSIS — N390 Urinary tract infection, site not specified: Secondary | ICD-10-CM | POA: Diagnosis not present

## 2021-10-10 DIAGNOSIS — E1165 Type 2 diabetes mellitus with hyperglycemia: Secondary | ICD-10-CM

## 2021-10-10 DIAGNOSIS — E1122 Type 2 diabetes mellitus with diabetic chronic kidney disease: Secondary | ICD-10-CM

## 2021-10-10 DIAGNOSIS — F329 Major depressive disorder, single episode, unspecified: Secondary | ICD-10-CM | POA: Diagnosis not present

## 2021-10-10 DIAGNOSIS — Z789 Other specified health status: Secondary | ICD-10-CM

## 2021-10-10 DIAGNOSIS — N1832 Chronic kidney disease, stage 3b: Secondary | ICD-10-CM

## 2021-10-10 DIAGNOSIS — T83510A Infection and inflammatory reaction due to cystostomy catheter, initial encounter: Secondary | ICD-10-CM | POA: Diagnosis not present

## 2021-10-10 DIAGNOSIS — Z960 Presence of urogenital implants: Secondary | ICD-10-CM

## 2021-10-10 DIAGNOSIS — Z951 Presence of aortocoronary bypass graft: Secondary | ICD-10-CM

## 2021-10-10 DIAGNOSIS — Z87440 Personal history of urinary (tract) infections: Secondary | ICD-10-CM

## 2021-10-10 DIAGNOSIS — E43 Unspecified severe protein-calorie malnutrition: Secondary | ICD-10-CM

## 2021-10-10 DIAGNOSIS — T83511A Infection and inflammatory reaction due to indwelling urethral catheter, initial encounter: Secondary | ICD-10-CM

## 2021-10-10 DIAGNOSIS — Z794 Long term (current) use of insulin: Secondary | ICD-10-CM

## 2021-10-10 DIAGNOSIS — Z8551 Personal history of malignant neoplasm of bladder: Secondary | ICD-10-CM

## 2021-10-10 DIAGNOSIS — F332 Major depressive disorder, recurrent severe without psychotic features: Secondary | ICD-10-CM | POA: Diagnosis not present

## 2021-10-10 LAB — URINE CULTURE: Culture: 10000 — AB

## 2021-10-10 LAB — BASIC METABOLIC PANEL
Anion gap: 5 (ref 5–15)
BUN: 19 mg/dL (ref 8–23)
CO2: 21 mmol/L — ABNORMAL LOW (ref 22–32)
Calcium: 7.8 mg/dL — ABNORMAL LOW (ref 8.9–10.3)
Chloride: 110 mmol/L (ref 98–111)
Creatinine, Ser: 1.3 mg/dL — ABNORMAL HIGH (ref 0.44–1.00)
GFR, Estimated: 42 mL/min — ABNORMAL LOW (ref >60)
Glucose, Bld: 192 mg/dL — ABNORMAL HIGH (ref 70–99)
Potassium: 4.4 mmol/L (ref 3.5–5.1)
Sodium: 136 mmol/L (ref 135–145)

## 2021-10-10 LAB — CBC
HCT: 30.1 % — ABNORMAL LOW (ref 36.0–46.0)
Hemoglobin: 10.4 g/dL — ABNORMAL LOW (ref 12.0–15.0)
MCH: 30.1 pg (ref 26.0–34.0)
MCHC: 34.6 g/dL (ref 30.0–36.0)
MCV: 87.2 fL (ref 80.0–100.0)
Platelets: 136 10*3/uL — ABNORMAL LOW (ref 150–400)
RBC: 3.45 MIL/uL — ABNORMAL LOW (ref 3.87–5.11)
RDW: 13.8 % (ref 11.5–15.5)
WBC: 3.9 10*3/uL — ABNORMAL LOW (ref 4.0–10.5)
nRBC: 0 % (ref 0.0–0.2)

## 2021-10-10 LAB — GLUCOSE, CAPILLARY
Glucose-Capillary: 184 mg/dL — ABNORMAL HIGH (ref 70–99)
Glucose-Capillary: 165 mg/dL — ABNORMAL HIGH (ref 70–99)
Glucose-Capillary: 127 mg/dL — ABNORMAL HIGH (ref 70–99)
Glucose-Capillary: 146 mg/dL — ABNORMAL HIGH (ref 70–99)

## 2021-10-10 LAB — MAGNESIUM: Magnesium: 1.8 mg/dL (ref 1.7–2.4)

## 2021-10-10 LAB — PHOSPHORUS: Phosphorus: 4.5 mg/dL (ref 2.5–4.6)

## 2021-10-10 MED ORDER — CEFAZOLIN SODIUM-DEXTROSE 1-4 GM/50ML-% IV SOLN
1.0000 g | Freq: Two times a day (BID) | INTRAVENOUS | Status: DC
  Administered 2021-10-10 – 2021-10-11 (×2): 1 g via INTRAVENOUS
  Filled 2021-10-10 (×3): qty 50

## 2021-10-10 MED ORDER — MORPHINE SULFATE (PF) 2 MG/ML IV SOLN
1.0000 mg | Freq: Once | INTRAVENOUS | Status: AC
  Administered 2021-10-11: 1 mg via INTRAVENOUS
  Filled 2021-10-10: qty 1

## 2021-10-10 NOTE — Plan of Care (Signed)
  Problem: Health Behavior/Discharge Planning: Goal: Ability to manage health-related needs will improve Outcome: Progressing   Problem: Clinical Measurements: Goal: Diagnostic test results will improve Outcome: Progressing   

## 2021-10-10 NOTE — Progress Notes (Signed)
Paged Randol Kern, NP - Pt is complaining that her head feels like it is in two pieces on the inside and banging together. It also hurts in the front. She had Tylenol at 2151. She started a new med, bupropion, today.

## 2021-10-10 NOTE — TOC Progression Note (Addendum)
Transition of Care [facility]) - Progression Note    Patient Details  Name: Iliani Vejar MRN: [id number] Date of Birth: March 11, 1941  Transition of Care [facility]) CM/SW Contact  Izola Price, RN Phone Number: 10/10/2021, 9:56 AM  Clinical Narrative: 2/4: Reportedly from [place] and was visiting sister.  Contact: Hurd,Catherine (Sister) [phone] [facility] Phone) OT recommended [facility] with DME BSC/3:1. Has Cane and Rollator at home setting.  PT evaluation pending recommendations.    Will monitor for PT recommendations today though prior CM states patient declined [facility] services. Simmie Davies RN CM [phone]    Expected Discharge Plan: Home/Self Care Barriers to Discharge: Continued Medical Work up  Expected Discharge Plan and Services Expected Discharge Plan: Home/Self Care   Discharge Planning Services: CM Consult   Living arrangements for the past 2 months: Single Family Home                                       Social Determinants of Health (SDOH) Interventions    Readmission Risk Interventions No flowsheet data found.

## 2021-10-10 NOTE — Consult Note (Signed)
Pharmacy Antibiotic Note  Kari Woods is a 81 y.o. female admitted on 10/07/2021 with UTI.  Pharmacy has been consulted for Ancef dosing.  Plan: Today is day 4 of abx; narrowing Merrem > Ancef per urine culture result. Initiate Ancef 1g q12h at 2200/1000  Height: 5\' 8"  (172.7 cm) Weight: 49.9 kg (110 lb) IBW/kg (Calculated) : 63.9  Temp (24hrs), Avg:97.8 F (36.6 C), Min:97.5 F (36.4 C), Max:98.4 F (36.9 C)  Recent Labs  Lab 10/07/21 2014 10/07/21 2201 10/08/21 0356 10/09/21 0537 10/10/21 0603  WBC 11.3*  --  10.0 5.7 3.9*  CREATININE 1.53*  --  1.45* 1.33* 1.30*  LATICACIDVEN 1.0 1.1  --   --   --     Estimated Creatinine Clearance: 28.1 mL/min (A) (by C-G formula based on SCr of 1.3 mg/dL (H)).    Allergies  Allergen Reactions   Bee Venom Swelling and Other (See Comments)    Other reaction(s): Unknown Other reaction(s): Swelling/Edema Severity: Swelling/Edema - Moderate; Type: Animal;  Other reaction(s): Swelling/Edema Severity: Swelling/Edema - Moderate; Type: Animal;      Antimicrobials this admission: CTX x1 (2/01)        Merrem (2/02-2/04)                    Ancef (2/04 >> Dose adjustments this admission: CTM and adjust PRN  Microbiology results: 2/01 BCx: NGTD 2/01 UCx: Ecoli 10k CFU (Pan-S)  2/01: Flu/Cov - negative  Thank you for allowing pharmacy to be a part of this patients care.  Lorna Dibble 10/10/2021 5:32 PM

## 2021-10-10 NOTE — Hospital Course (Signed)
Pt arrives with [facility] from home c/o possible UTI; pt is visiting from out of town and has hx of sepsis and UTI. Pt has suprapubic cath and reports "dark brown/bloody urine". PT given 500 ml fluid bolus by EMS. Daughter is POA and lives in [place].  Kari Woods is a 81 y.o. female with medical history significant of DM, HTN, HLD, CKD 3B, TIA, CAD s/p CABG, depression, bladder cancer s/p cystectomy with [place] pouch who self catheterizes and with history of frequent UTIs including ESBL, with prior tobramycin irrigations every 2 days, until her relocation to [place] in November 2022 from [place], who presents to the ED with a complaint of dark and foul-smelling urine as well as fever.  She was in her usual state of health until 2 days ago when she started feeling weak and had decreased appetite, developing a fever on the day of arrival.  She denies abdominal pain, diarrhea, chest pain, shortness of breath or cough.  * Catheter-associated urinary tract infection (HCC) Rocephin IV hydration   Frequent UTI History of frequent UTIs including ESBL, seen on review of past urology notes History of prophylactic irrigation with tobramycin/gentamicin q 2 days up until November 2022 when she relocated to [place] from [place]   Self-catheterizes continent urinary pouch ([place] pouch) Self-catheterization 4 times daily while awake, no cystoscopy performed if patient unable to Pouch care and stoma care Consider urology consult   Hypokalemia Being repleted. Continue to monitor   History of bladder cancer s/p cystectectomy with [place] pouch S/p [place] pouch in 2004 No acute issues.   Patient has not set up care with a urologist as yet since her relocation to [facility] from [place] in 07/2021 Consider inpatient urology consult or outpatient referral   Uncontrolled type 2 diabetes mellitus with hyperglycemia, with long-term current use of insulin ([place]) Blood sugars in the mid 200s Sliding scale  insulin coverage Resume basal insulin pending med rec   Stage 3b chronic kidney disease ([place]) Renal function at baseline when compared to labs in Care Everywhere   S/P coronary artery bypass graft x 5 Continue home meds   Coronary artery disease involving native coronary artery- (present on admission) No complaints of chest pain   History of TIA (transient ischemic attack) Continue home meds   Major depressive disorder- (present on admission) Continue home meds, sertraline Psych consult placed (due to triggered alert)   Hypertension, essential- (present on admission) BP stable, continue home meds  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  Side effects explained to the patient and she is agreeable to this.  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  Evaluated by PT/OT, declined Home health PT/OT.  Catheter-associated urinary tract infection (HCC) Rocephin dc'd and replaced by Merrem on 2/223 Continue gentle IV hydration LR Kcl40 meq at 75cc/hr x 2 days   Frequent UTIs History of frequent UTIs including ESBL, seen on review of past urology notes History of prophylactic irrigation with tobramycin/gentamicin q 2 days up until November 2022 when she relocated to [place] from [place] Will need to follow up with urology outpatient   Self-catheterizes continent urinary pouch ([place] pouch) Self-catheterization 4 times daily while awake If acute issues consult urology, none reported at the time of this dictation.   Non  anion gap metabolic acidosis in the setting of CKD3B AG 7 and serum CO2 19 Continue IV fluid hydration Repeat BMP AM   Refractory Hypokalemia K+ 2.7  Being repleted intravenously and orally.  Repeat BMP in the AM Magnesium 1.9    Hypophosphatemia Phosphlorous 1.2 Repleted intravenously Repeat level in the AM   History of bladder cancer s/p cystectectomy with [place] pouch S/p [place] pouch in 2004 No acute issues.   Patient has not set up care with a urologist as yet since her relocation to [facility] from [place] in 07/2021 Consider inpatient urology outpatient referral   Uncontrolled type 2 diabetes mellitus with hyperglycemia, with long-term current use of insulin ([place]) A1C 7.5 10/08/21 Sliding scale insulin coverage CBG high   Stage 3b chronic kidney disease ([place]) Renal function at baseline when compared to labs in Care Everywhere Cr 1.33 with GFR 41   S/P coronary artery bypass graft x 5 Denies any anginal symptoms Continue home meds Continue to monitor with remote telemetry.   Coronary artery disease involving native coronary artery- (present on admission) No complaints of chest pain Denies any anginal symptoms Not on goal-directed medical therapy prior to admission    History of TIA (transient ischemic attack) No aspirin or statin on home medications   Major depressive disorder- (present on admission) Continue home meds, sertraline Psych consult placed (due to triggered alert)  Medication management and Plan propose to patient that in light of this new information we change her antidepressant again this time perhaps trying bupropion.  Explained that this medicine tends to be stimulating.  Will only be given in the morning and will be started at a low dose.  Patient expressed understanding and agreement.  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

## 2021-10-10 NOTE — Progress Notes (Addendum)
°  Progress Note   Patient: Kari Woods LNL:[id number] DOB: 1940-09-08 DOA: 10/07/2021     3 DOS: the patient was seen and examined on 10/10/2021   Brief hospital course: 81 year old woman PMH bladder cancer status post cystectomy, [place] pouch, self catheterize eyes, frequent UTIs including ESBL, from [place], presented with fever, admitted for complicated UTI.  * Catheter-associated urinary tract infection (HCC) --Afebrile now for 48 hours, clinically improving.  E. coli pansensitive.  Narrow to cefazolin today.  Can likely change to oral antibiotics tomorrow.   Frequent UTI --History of frequent UTIs including ESBL, seen on review of past urology notes   Self-catheterizes continent urinary pouch ([place] pouch) --Self-catheterization 4 times daily while awake -- Patient lives in [place] and plans to return there.   History of bladder cancer s/p cystectectomy with [place] pouch --S/p [place] pouch in 2004 --Follow-up with urologist in [place] as outpatient   Uncontrolled type 2 diabetes mellitus with hyperglycemia. --Unclear whether on insulin at home.  CBG stable.    Stage 3b chronic kidney disease ([place]) --Appears to be stable.   S/P coronary artery bypass graft x 5 --Continue home meds   Major depressive disorder, moderate to severe- (present on admission) --No inpatient treatment required, however psychiatry did change SSRI to bupropion.   Evaluated by PT/OT, declined Home health PT/OT.  Severe protein calorie malnutrition/underweight --BMI 16 Resume home appetite stimulant Megace Encourage increase in protein calorie intake  Emphysema  --Appears subclinical.  Mild thrombocytopenia, follow-up.  Overall improving, may be able to go home in the next 24 hours.     Subjective:  Feels better, no n/v today, tolerating diet, no pain  Physical Exam: Vitals:   10/09/21 1527 10/09/21 2000 10/10/21 0519 10/10/21 0727  BP: 110/69 116/69 117/67 107/67  Pulse: 73 80  72 75  Resp: 18 20 16 20   Temp: 98.6 F (37 C) 98.4 F (36.9 C) (!) 97.5 F (36.4 C) 97.7 F (36.5 C)  TempSrc:    Oral  SpO2: 96% 98% 100% 98%  Weight:      Height:       Physical Exam Vitals reviewed.  Constitutional:      General: She is not in acute distress.    Appearance: She is not ill-appearing or toxic-appearing.  Cardiovascular:     Rate and Rhythm: Normal rate and regular rhythm.     Heart sounds: No murmur heard. Pulmonary:     Effort: Pulmonary effort is normal. No respiratory distress.     Breath sounds: No wheezing, rhonchi or rales.  Neurological:     Mental Status: She is alert.  Psychiatric:        Mood and Affect: Mood normal.        Behavior: Behavior normal.   Data Reviewed:  Creatinine stable 1.3, BUN down to 19, CBC stable, urine culture pansensitive E. coli  Family Communication: updated daughter by telephone  Disposition: Status is: Inpatient  Remains inpatient appropriate because: UTI    Planned Discharge Destination: Home     Time spent: 35 minutes  Author: Murray Hodgkins, MD 10/10/2021 12:36 PM  For on call review [email or web address].

## 2021-10-11 ENCOUNTER — Inpatient Hospital Stay: Payer: Medicare (Managed Care)

## 2021-10-11 DIAGNOSIS — N390 Urinary tract infection, site not specified: Secondary | ICD-10-CM | POA: Diagnosis not present

## 2021-10-11 DIAGNOSIS — N1832 Chronic kidney disease, stage 3b: Secondary | ICD-10-CM | POA: Diagnosis not present

## 2021-10-11 DIAGNOSIS — T83510A Infection and inflammatory reaction due to cystostomy catheter, initial encounter: Secondary | ICD-10-CM | POA: Diagnosis not present

## 2021-10-11 LAB — CREATININE, SERUM
Creatinine, Ser: 1.4 mg/dL — ABNORMAL HIGH (ref 0.44–1.00)
GFR, Estimated: 39 mL/min — ABNORMAL LOW (ref >60)

## 2021-10-11 MED ORDER — HEPARIN SOD (PORK) LOCK FLUSH 100 UNIT/ML IV SOLN
500.0000 [IU] | INTRAVENOUS | Status: DC | PRN
  Filled 2021-10-11 (×2): qty 5

## 2021-10-11 MED ORDER — CEPHALEXIN 500 MG PO CAPS: 500.0000 mg | ORAL_CAPSULE | Freq: Two times a day (BID) | ORAL | 0 refills | Status: AC

## 2021-10-11 MED ORDER — BUPROPION HCL ER (XL) 150 MG PO TB24: 150.0000 mg | ORAL_TABLET | Freq: Every day | ORAL | 1 refills | Status: AC

## 2021-10-11 NOTE — Progress Notes (Signed)
Patient discharged with sister va self/private transport. Patient given discharge instructions with no questions following up RN went over patient medication instructions and Patient expressed understanding. Medications sent to [facility] per patient request. Port deaccessed by IV team. VSS on discharge. Patient given suicide numbers and educated.

## 2021-10-11 NOTE — Plan of Care (Signed)
Problem: Education: Goal: Knowledge of General Education information will improve Description: Including pain rating scale, medication(s)/side effects and non-pharmacologic comfort measures Outcome: Completed/Met   Problem: Health Behavior/Discharge Planning: Goal: Ability to manage health-related needs will improve Outcome: Completed/Met   Problem: Clinical Measurements: Goal: Ability to maintain clinical measurements within normal limits will improve Outcome: Completed/Met Goal: Will remain free from infection Outcome: Completed/Met Goal: Diagnostic test results will improve Outcome: Completed/Met Goal: Respiratory complications will improve Outcome: Completed/Met Goal: Cardiovascular complication will be avoided Outcome: Completed/Met   Problem: Activity: Goal: Risk for activity intolerance will decrease Outcome: Completed/Met   Problem: Nutrition: Goal: Adequate nutrition will be maintained Outcome: Completed/Met   Problem: Coping: Goal: Level of anxiety will decrease Outcome: Completed/Met   Problem: Elimination: Goal: Will not experience complications related to bowel motility Outcome: Completed/Met Goal: Will not experience complications related to urinary retention Outcome: Completed/Met   Problem: Pain Managment: Goal: General experience of comfort will improve Outcome: Completed/Met   Problem: Safety: Goal: Ability to remain free from injury will improve Outcome: Completed/Met   Problem: Skin Integrity: Goal: Risk for impaired skin integrity will decrease Outcome: Completed/Met   Problem: Acute Rehab PT Goals(only PT should resolve) Goal: Pt Will Go Supine/Side To Sit Outcome: Completed/Met Goal: Patient Will Transfer Sit To/From Stand Outcome: Completed/Met Goal: Pt Will Ambulate Outcome: Completed/Met   Problem: Acute Rehab OT Goals (only OT should resolve) Goal: Pt. Will Perform Grooming Outcome: Completed/Met Goal: Pt. Will Perform Lower  Body Dressing Outcome: Completed/Met Goal: Pt. Will Transfer To Toilet Outcome: Completed/Met

## 2021-10-11 NOTE — Progress Notes (Signed)
Cross Cover Patient complained of headache described as her "head being in two pieced on the inside and banging together" and also hurting in the front. No relief from acetaminophen. VSS. Given 1 mg morphine.  Head CT negative for acute findings

## 2021-10-11 NOTE — Discharge Summary (Addendum)
Physician Discharge Summary   Patient: Kari Woods MRN: [id number] DOB: 07-29-1941  Admit date:     10/07/2021  Discharge date: 10/11/21  Discharge Physician: Murray Hodgkins   PCP: Pcp, No   Recommendations at discharge:   Resolution of UTI see below  Discharge Diagnoses: Principal Problem:   Catheter-associated urinary tract infection ([place]) Active Problems:   Stage 3b chronic kidney disease ([place])   Self-catheterizes continent urinary pouch ([place] pouch)   Frequent UTI   Coronary artery disease involving native coronary artery   Hypertension, essential   Major depressive disorder   S/P coronary artery bypass graft x 5   History of TIA (transient ischemic attack)   History of bladder cancer s/p cystectectomy with [place] pouch   Uncontrolled type 2 diabetes mellitus with hyperglycemia, with long-term current use of insulin (HCC)   Hypokalemia   Severe recurrent major depression without psychotic features ([place])  Resolved Problems:   * No resolved hospital problems. *   Hospital Course: 81 year old woman PMH bladder cancer status post cystectomy, [place] pouch, self catheterize eyes, frequent UTIs including ESBL, from [place], presented with fever, admitted for complicated UTI.  Treated with empiric antibiotics with rapid clinical improvement, discharged home on Keflex.  Referred for outpatient follow-up with local urology until she returns to [place].  * Catheter-associated urinary tract infection (HCC) --Responded well to antibiotics, now clinically resolved.   Frequent UTI --As above   Self-catheterizes continent urinary pouch ([place] pouch) --Self-catheterization 4 times daily while awake --Outpatient follow-up with urology   History of bladder cancer s/p cystectectomy with [place] pouch S/p [place] pouch in 2004 No acute issues.     Uncontrolled type 2 diabetes mellitus with hyperglycemia, with long-term current use of insulin (HCC) --CBG stable   Stage  3b chronic kidney disease (HCC) --Creatinine stable   Major depressive disorder- (present on admission) -- Seen by psychiatry, recommended change from SSRI to Wellbutrin.  Outpatient follow-up.   Severe protein calorie malnutrition/underweight BMI 16 Albumin 3.0 Resume home appetite stimulant Megace Continue to encourage increase in protein calorie intake    Consultants: none Procedures performed: none  Disposition: Home Diet recommendation:  Discharge Diet Orders (From admission, onward)     Start     Ordered   10/11/21 0000  Diet general        10/11/21 0953           Regular diet  DISCHARGE MEDICATION: Allergies as of 10/11/2021       Reactions   Bee Venom Swelling, Other (See Comments)   Other reaction(s): Unknown Other reaction(s): Swelling/Edema Severity: Swelling/Edema - Moderate; Type: Animal;  Other reaction(s): Swelling/Edema Severity: Swelling/Edema - Moderate; Type: Animal;         Medication List     STOP taking these medications    sertraline 50 MG tablet Commonly known as: ZOLOFT       TAKE these medications    buPROPion 150 MG 24 hr tablet Commonly known as: WELLBUTRIN XL Take 1 tablet (150 mg total) by mouth daily. Start taking on: October 12, 2021   cephALEXin 500 MG capsule Commonly known as: KEFLEX Take 1 capsule (500 mg total) by mouth 2 (two) times daily for 5 days.   gabapentin 300 MG capsule Commonly known as: NEURONTIN Take 300 mg by mouth 2 (two) times daily.   megestrol 40 MG tablet Commonly known as: MEGACE Take 40 mg by mouth daily.   pantoprazole 40 MG tablet Commonly known as: PROTONIX Take by mouth.  Follow-up [facility]. Schedule an appointment as soon as possible for a visit in 1 week(s).   Specialty: Urology Contact information: [place], [place] [phone]               Feels good Did have  head pain last night but this resolved.  Feels fine today.  CT head was negative.  Discharge Exam: Filed Weights   10/08/21 0137  Weight: 49.9 kg   Physical Exam Constitutional:      General: She is not in acute distress.    Appearance: She is not ill-appearing or toxic-appearing.  Cardiovascular:     Rate and Rhythm: Normal rate and regular rhythm.     Heart sounds: No murmur heard. Pulmonary:     Effort: Pulmonary effort is normal. No respiratory distress.     Breath sounds: No wheezing, rhonchi or rales.  Neurological:     Mental Status: She is alert.  Psychiatric:        Mood and Affect: Mood normal.        Behavior: Behavior normal.   Condition at discharge: good  The results of significant diagnostics from this hospitalization (including imaging, microbiology, ancillary and laboratory) are listed below for reference.   Imaging Studies: CT HEAD WO CONTRAST (5MM)  Result Date: 10/11/2021 CLINICAL DATA:  Headache, new or worsening (Age >= 50y). EXAM: CT HEAD WITHOUT CONTRAST TECHNIQUE: Contiguous axial images were obtained from the base of the skull through the vertex without intravenous contrast. RADIATION DOSE REDUCTION: This exam was performed according to the departmental dose-optimization program which includes automated exposure control, adjustment of the mA and/or kV according to patient size and/or use of iterative reconstruction technique. COMPARISON:  None. FINDINGS: Brain: Normal anatomic configuration. No abnormal intra or extra-axial mass lesion or fluid collection. No abnormal mass effect or midline shift. No evidence of acute intracranial hemorrhage or infarct. Ventricular size is normal. Cerebellum unremarkable. Vascular: Unremarkable Skull: Intact Sinuses/Orbits: Paranasal sinuses are clear. Ocular lenses have been removed. Orbits are otherwise unremarkable. Other: Mastoid air cells and middle ear cavities are clear. IMPRESSION: No acute intracranial abnormality.  Electronically Signed   By: Fidela Salisbury M.D.   On: 10/11/2021 00:32   IR CV Line Injection  Result Date: 10/09/2021 INDICATION: Port malfunction EXAM: Port check using fluoroscopy MEDICATIONS: None ANESTHESIA/SEDATION: None FLUOROSCOPY TIME:  Fluoroscopy Time: 0.5 minutes (2 mGy) COMPLICATIONS: None immediate. PROCEDURE: The patient was placed supine on the exam table. The port was accessed prior to arrival. The port was found to flush appropriately, but could not aspirate blood return. Initial fluoroscopic evaluation demonstrates a right chest port which appears to enter via the right internal jugular vein. The catheter tip terminates in the SVC. The catheter appears to course normally without kink or discontinuity. Contrast injection of the port was performed. This demonstrates a fibrin sheath that is flush with the tip of the catheter. There is reflux of contrast material around the fibrin sheath with contrast material entering the right brachiocephalic vein, and then flowing centrally into the SVC. The port was saline locked at the end of the procedure. IMPRESSION: 1. Evaluation of the right chest port demonstrates large fibrin sheath. If appropriate, the patient can be scheduled for port revision with interventional radiology. 2. The port, as it is currently access, would be safe to use for IV medication administration. Electronically Signed   By: Albin Felling M.D.   On: 10/09/2021  12:53   DG Chest Port 1 View  Result Date: 10/07/2021 CLINICAL DATA:  Questionable sepsis - evaluate for abnormality EXAM: PORTABLE CHEST 1 VIEW COMPARISON:  Chest x-ray 08/03/2021 FINDINGS: Right chest Port-A-Cath with tip overlying the expected region of the distal superior vena cava. The heart and mediastinal contours are unchanged. Surgical changes overlie the mediastinum. Hyperinflation of the lungs. Persistent biapical nodular thickening. No focal consolidation. Chronic or central markings with no overt pulmonary  edema. No pleural effusion. No pneumothorax. No acute osseous abnormality. IMPRESSION: 1. No active disease. 2.  Emphysema (ICD10-J43.9). Electronically Signed   By: Iven Finn M.D.   On: 10/07/2021 19:57    Microbiology: Results for orders placed or performed during the hospital encounter of 10/07/21  Resp Panel by RT-PCR (Flu A&B, Covid) Nasopharyngeal Swab     Status: None   Collection Time: 10/07/21  7:35 PM   Specimen: Nasopharyngeal Swab; Nasopharyngeal(NP) swabs in vial transport medium  Result Value Ref Range Status   SARS Coronavirus 2 by RT PCR NEGATIVE NEGATIVE Final    Comment: (NOTE) SARS-CoV-2 target nucleic acids are NOT DETECTED.  The SARS-CoV-2 RNA is generally detectable in upper respiratory specimens during the acute phase of infection. The lowest concentration of SARS-CoV-2 viral copies this assay can detect is 138 copies/mL. A negative result does not preclude SARS-Cov-2 infection and should not be used as the sole basis for treatment or other patient management decisions. A negative result may occur with  improper specimen collection/handling, submission of specimen other than nasopharyngeal swab, presence of viral mutation(s) within the areas targeted by this assay, and inadequate number of viral copies(<138 copies/mL). A negative result must be combined with clinical observations, patient history, and epidemiological information. The expected result is Negative.  Fact Sheet for Patients:  EntrepreneurPulse.com.au  Fact Sheet for Healthcare Providers:  IncredibleEmployment.be  This test is no t yet approved or cleared by the [place] FDA and  has been authorized for detection and/or diagnosis of SARS-CoV-2 by FDA under an Emergency Use Authorization (EUA). This EUA will remain  in effect (meaning this test can be used) for the duration of the COVID-19 declaration under Section 564(b)(1) of the Act,  21 U.S.C.section 360bbb-3(b)(1), unless the authorization is terminated  or revoked sooner.       Influenza A by PCR NEGATIVE NEGATIVE Final   Influenza B by PCR NEGATIVE NEGATIVE Final    Comment: (NOTE) The Xpert Xpress SARS-CoV-2/FLU/RSV plus assay is intended as an aid in the diagnosis of influenza from Nasopharyngeal swab specimens and should not be used as a sole basis for treatment. Nasal washings and aspirates are unacceptable for Xpert Xpress SARS-CoV-2/FLU/RSV testing.  Fact Sheet for Patients: EntrepreneurPulse.com.au  Fact Sheet for Healthcare Providers: IncredibleEmployment.be  This test is not yet approved or cleared by the [place] FDA and has been authorized for detection and/or diagnosis of SARS-CoV-2 by FDA under an Emergency Use Authorization (EUA). This EUA will remain in effect (meaning this test can be used) for the duration of the COVID-19 declaration under Section 564(b)(1) of the Act, 21 U.S.C. section 360bbb-3(b)(1), unless the authorization is terminated or revoked.  Performed at [facility], [address] [place], [place] [address]   Blood Culture (routine x 2)     Status: None (Preliminary result)   Collection Time: 10/07/21  7:35 PM   Specimen: BLOOD  Result Value Ref Range Status   Specimen Description BLOOD RIGHT ANTECUBITAL  Final   Special Requests  Final    BOTTLES DRAWN AEROBIC AND ANAEROBIC Blood Culture results may not be optimal due to an excessive volume of blood received in culture bottles   Culture   Final    NO GROWTH 4 DAYS Performed at [facility], [address] [place], [place] [address]    Report Status PENDING  Incomplete  Urine Culture     Status: Abnormal   Collection Time: 10/07/21 10:01 PM   Specimen: Urine, Random  Result Value Ref Range Status   Specimen Description   Final    URINE, RANDOM Performed at [facility], [address]  [place], [place] [address]    Special Requests   Final    NONE Performed at [facility], [address] [place], [place] [address]    Culture 10,000 COLONIES/mL ESCHERICHIA COLI (A)  Final   Report Status 10/10/2021 FINAL  Final   Organism ID, Bacteria ESCHERICHIA COLI (A)  Final      Susceptibility   Escherichia coli - MIC*    AMPICILLIN <=2 SENSITIVE Sensitive     CEFAZOLIN <=4 SENSITIVE Sensitive     CEFEPIME <=0.12 SENSITIVE Sensitive     CEFTRIAXONE <=0.25 SENSITIVE Sensitive     CIPROFLOXACIN <=0.25 SENSITIVE Sensitive     GENTAMICIN <=1 SENSITIVE Sensitive     IMIPENEM <=0.25 SENSITIVE Sensitive     NITROFURANTOIN <=16 SENSITIVE Sensitive     TRIMETH/SULFA <=20 SENSITIVE Sensitive     AMPICILLIN/SULBACTAM <=2 SENSITIVE Sensitive     PIP/TAZO <=4 SENSITIVE Sensitive     * 10,000 COLONIES/mL ESCHERICHIA COLI  Blood Culture (routine x 2)     Status: None (Preliminary result)   Collection Time: 10/07/21 10:02 PM   Specimen: BLOOD  Result Value Ref Range Status   Specimen Description BLOOD RIGHT FOREARM  Final   Special Requests   Final    BOTTLES DRAWN AEROBIC AND ANAEROBIC Blood Culture results may not be optimal due to an excessive volume of blood received in culture bottles   Culture   Final    NO GROWTH 4 DAYS Performed at [facility], [address] [place], [place] [address]    Report Status PENDING  Incomplete    Labs: CBC: Recent Labs  Lab 10/07/21 2014 10/08/21 0356 10/09/21 0537 10/10/21 0603  WBC 11.3* 10.0 5.7 3.9*  NEUTROABS 9.6*  --   --   --   HGB 11.7* 10.2* 10.2* 10.4*  HCT 34.7* 30.0* 29.9* 30.1*  MCV 89.4 89.0 87.9 87.2  PLT 156 140* 135* 979*   Basic Metabolic Panel: Recent Labs  Lab 10/07/21 2014 10/08/21 0356 10/08/21 1138 10/09/21 0537 10/10/21 0603 10/11/21 0826  NA 136 136  --  139 136  --   K 3.2* 2.7* 3.5 2.7* 4.4  --   CL 111 114*  --  113* 110  --   CO2 18* 18*  --  19* 21*  --   GLUCOSE 247* 208*  --   206* 192*  --   BUN 53* 45*  --  29* 19  --   CREATININE 1.53* 1.45*  --  1.33* 1.30* 1.40*  CALCIUM 8.5* 7.8*  --  8.2* 7.8*  --   MG  --   --   --  1.9 1.8  --   PHOS  --   --   --  1.2* 4.5  --    Liver Function Tests: Recent Labs  Lab 10/07/21 2014  AST 16  ALT  13  ALKPHOS 68  BILITOT 1.0  PROT 6.6  ALBUMIN 3.2*   CBG: Recent Labs  Lab 10/09/21 2127 10/10/21 0723 10/10/21 1142 10/10/21 1610 10/10/21 2150  GLUCAP 184* 184* 165* 127* 146*    Discharge time spent: greater than 30 minutes.  Signed: Murray Hodgkins, MD [facility] 10/11/2021

## 2021-10-11 NOTE — TOC Transition Note (Signed)
Transition of Care [facility]) - CM/SW Discharge Note   Patient Details  Name: Kari Woods MRN: [id number] Date of Birth: 03/29/1941  Transition of Care [facility]) CM/SW Contact:  Izola Price, RN Phone Number: 10/11/2021, 10:30 AM   Clinical Narrative: 2/5: Patient being discharged today. Decline HH services. Requested a BSC per OT. DME order in and [employer] notified to deliver today pending discharge.  Contact Jeneen Montgomery at [phone]. Simmie Davies RN CM      Final next level of care: Home/Self Care Barriers to Discharge: Barriers Resolved   Patient Goals and CMS Choice        Discharge Placement                       Discharge Plan and Services   Discharge Planning Services: CM Consult            DME Arranged: 3-N-1 DME Agency: [employer] Date DME Agency Contacted: 10/11/21 Time DME Agency Contacted: 56 Representative spoke with at DME Agency: [place]: NA ([place]) [place]: NA        Social Determinants of Health ([place]) Interventions     Readmission Risk Interventions No flowsheet data found.

## 2021-10-12 LAB — CULTURE, BLOOD (ROUTINE X 2)
Culture: NO GROWTH
Culture: NO GROWTH

## 2021-10-29 ENCOUNTER — Other Ambulatory Visit: Payer: Self-pay | Admitting: *Deleted

## 2021-10-29 DIAGNOSIS — Z8551 Personal history of malignant neoplasm of bladder: Secondary | ICD-10-CM

## 2021-10-29 NOTE — Progress Notes (Signed)
03/70/48 8:89 PM   Kari Woods [birth year] [id number]  Referring provider:  Samuella Cota, MD [address] [place],   [id number] Chief Complaint  Patient presents with   History of bladder cancer s/p cystectectomy with [place] po     HPI: Kari Woods is a 81 y.o.female who presents today for further evaluation of UTI.  She has a complex GU history originally from [place] and is here visiting her sister in [place] for several months.  She is eventually looking to move here perhaps in November.  She is seeking to establish care in [facility] with urology as well as multiple other specialist including nephrology, cardiology, oncology, etc.  She does not have a primary care physician yet either.  She has a personal history of mild chronic right hydronephrosis, bladder cancer status post cystectomy, [place] pouch, Self-catheterization 4 times daily while awake, CKD stage 3b, frequent UTIs including ESBL with prior tobramycin irrigations every 2 day.  I was able to review her records from [place], Dr. Wilmon Woods.  She was seen in the hospital on 10/07/2021 and admitted for complicated UTI. Urinalysis showed trace Hgb, small leukocytes, and many bacteria. Urine culture grew E.coli. Renal function was at baseline when compared to labs in [place]. She was treated with IV rocephin and IV hydration.  Presenting symptom was altered mental status and fevers.  No imaging was performed.  She reports that the symptoms she had were due to low potassium and not due to UTI. She never had symptoms like she had in the hospital before.  Typically her symptoms with UTI include fevers after alternative sources are ruled out.  She reports that she irrigated every day. She uses a 14 FR long catheter. She has been irrigating for about 4 year. She reports that they started irrigation due to her recurrent UTIs and one bad one that turned into sepsis.   She reports that she  is usually has a fever when she has a UTI. She reports that she has an extensive history of cancer; bladder cancer, lymphoplasmatic lymphoma, and breast cancer, she is in remission.   PMH: Past Medical History:  Diagnosis Date   Cancer [facility])    Bladder cancer 2006, lymphoplasmatic lymphoma 2016, breast cancer 2017. in remission from all 3 since 2017   CKD (chronic kidney disease)    stage 3   Depression    Diabetes mellitus without complication (HCC)    GERD (gastroesophageal reflux disease)     Surgical History: Past Surgical History:  Procedure Laterality Date   ABDOMINAL HYSTERECTOMY     total   IR CV LINE INJECTION  10/09/2021   triple bypass  09/07/2011    Home Medications:  Allergies as of 10/30/2021       Reactions   Bee Venom Swelling, Other (See Comments)   Other reaction(s): Unknown Other reaction(s): Swelling/Edema Severity: Swelling/Edema - Moderate; Type: Animal;  Other reaction(s): Swelling/Edema Severity: Swelling/Edema - Moderate; Type: Animal;         Medication List        Accurate as of October 30, 2021  1:59 PM. If you have any questions, ask your nurse or doctor.          STOP taking these medications    megestrol 40 MG tablet Commonly known as: MEGACE Stopped by: Hollice Espy, MD       TAKE these medications    buPROPion 150 MG 24 hr tablet Commonly known  as: WELLBUTRIN XL Take 1 tablet (150 mg total) by mouth daily.   gabapentin 300 MG capsule Commonly known as: NEURONTIN Take 300 mg by mouth 2 (two) times daily.   pantoprazole 40 MG tablet Commonly known as: PROTONIX Take by mouth.        Allergies:  Allergies  Allergen Reactions   Bee Venom Swelling and Other (See Comments)    Other reaction(s): Unknown Other reaction(s): Swelling/Edema Severity: Swelling/Edema - Moderate; Type: Animal;  Other reaction(s): Swelling/Edema Severity: Swelling/Edema - Moderate; Type: Animal;      Family History: History  reviewed. No pertinent family history.  Social History:  reports that she has quit smoking. Her smoking use included cigarettes. She has never used smokeless tobacco. She reports that she does not currently use alcohol. She reports that she does not currently use drugs.   Physical Exam: BP 134/85    Pulse 94    Ht 5\' 8"  (1.727 m)    Wt 108 lb (49 kg)    BMI 16.42 kg/m   Constitutional:  Alert and oriented, No acute distress. HEENT: [place] AT, moist mucus membranes.  Trachea midline, no masses. Cardiovascular: No clubbing, cyanosis, or edema. Respiratory: Normal respiratory effort, no increased work of breathing. Skin: No rashes, bruises or suspicious lesions. Neurologic: Grossly intact, no focal deficits, moving all 4 extremities. Psychiatric: Normal mood and affect.  Laboratory Data: Lab Results  Component Value Date   CREATININE 1.40 (H) 10/11/2021   Lab Results  Component Value Date   HGBA1C 7.5 (H) 10/08/2021    Urinalysis Component     Latest Ref Rng & Units 10/30/2021  Color, Urine     YELLOW YELLOW  Appearance     CLEAR TURBID (A)  Specific Gravity, Urine     1.005 - 1.030 1.015  pH     5.0 - 8.0 7.0  Glucose, UA     NEGATIVE mg/dL NEGATIVE  Hgb urine dipstick     NEGATIVE SMALL (A)  Bilirubin Urine     NEGATIVE NEGATIVE  Ketones, ur     NEGATIVE mg/dL NEGATIVE  Protein     NEGATIVE mg/dL 100 (A)  Nitrite     NEGATIVE NEGATIVE  Leukocytes,Ua     NEGATIVE LARGE (A)  Squamous Epithelial / LPF     0 - 5 6-10  Non Squamous Epithelial     NONE SEEN PRESENT (A)  WBC, UA     0 - 5 WBC/hpf >50  RBC / HPF     0 - 5 RBC/hpf 11-20  Bacteria, UA     NONE SEEN MANY (A)  WBC Clumps      PRESENT  Mucus      PRESENT    Assessment & Plan:  History of bladder cancer  - S/p cystectectomy with [place] pouch in 2004 - Self-catheterization 4 times daily and irrigations  - Recommend she continue self-catheterization.  - she needs assistants with getting cath  supplies Catheters; prescribed today 29 [place] female straight cath  2. Recurrent UTI - Infections manifest as fevers  - Chronically colonized she keeps colony low with daily antibiotic irrigation - She has been using irrigations for tobramycin  and gentamicin irrigations however more recently gentamicin has been difficult to obtain thus using tobramycin irrigations only - Irrigations; prescribed  3. CKD stage 3 - Recommend she establish care with a PCP and nephrology, history of right hydronephrosis  4. History of kidney stones and chronic right hydronephrosis - She needs some imaging she  was getting a KUB and ultrasound previously. Will monitor with KUB and RUS with Zara Council, PA-C in 1 month.   Return in 1 month for KUB and RUS with Zara Council, PA-C  I,Kailey Littlejohn,acting as a scribe for Hollice Espy, MD.,have documented all relevant documentation on the behalf of Hollice Espy, MD,as directed by  Hollice Espy, MD while in the presence of Hollice Espy, MD.  I have reviewed the above documentation for accuracy and completeness, and I agree with the above.   Hollice Espy, MD   [facility] [address], [place], [place] [address] [phone]

## 2021-10-30 ENCOUNTER — Ambulatory Visit: Payer: Medicare (Managed Care) | Admitting: Urology

## 2021-10-30 ENCOUNTER — Encounter: Payer: Self-pay | Admitting: Urology

## 2021-10-30 ENCOUNTER — Other Ambulatory Visit: Payer: Self-pay

## 2021-10-30 ENCOUNTER — Other Ambulatory Visit
Admission: RE | Admit: 2021-10-30 | Discharge: 2021-10-30 | Disposition: A | Discharge status: Home / Self Care | Payer: Medicare (Managed Care) | Attending: Urology | Admitting: Urology

## 2021-10-30 VITALS — BP 134/85 | HR 94 | Ht 68.0 in | Wt 108.0 lb

## 2021-10-30 DIAGNOSIS — N1330 Unspecified hydronephrosis: Secondary | ICD-10-CM

## 2021-10-30 DIAGNOSIS — Z8551 Personal history of malignant neoplasm of bladder: Secondary | ICD-10-CM | POA: Diagnosis present

## 2021-10-30 LAB — URINALYSIS, COMPLETE (UACMP) WITH MICROSCOPIC
Bilirubin Urine: NEGATIVE
Glucose, UA: NEGATIVE mg/dL
Ketones, ur: NEGATIVE mg/dL
Nitrite: NEGATIVE
Protein, ur: 100 mg/dL — AB
Specific Gravity, Urine: 1.015 (ref 1.005–1.030)
WBC, UA: >50 WBC/hpf (ref 0–5)
pH: 7 (ref 5.0–8.0)

## 2021-10-30 MED ORDER — TOBRAMYCIN SULFATE 80 MG/2ML IJ SOLN
80.0000 mg | INTRAMUSCULAR | 11 refills | Status: DC
Start: 2021-10-30 — End: 2021-11-03

## 2021-10-30 MED ORDER — SODIUM CHLORIDE 0.9 % IR SOLN: 1000.0000 mL | Freq: Once | 6 refills | Status: AC

## 2021-11-03 ENCOUNTER — Telehealth: Payer: Self-pay | Admitting: Urology

## 2021-11-03 MED ORDER — TOBRAMYCIN SULFATE 80 MG/2ML IJ SOLN: 80.0000 mg | INTRAMUSCULAR | 11 refills | Status: AC

## 2021-11-03 NOTE — Telephone Encounter (Signed)
Patient called and is requesting Tobramycin to be sent to [facility] in [place].

## 2021-11-03 NOTE — Telephone Encounter (Signed)
Pt called office to let us know Kari Woods in [place] can't get her RX and referred her to [facility] in [place], who can't get it either.  She would like it to be sent to [facility] in [place].  See RX below:  tobramycin (NEBCIN) 80 MG/2ML SOLN injection [[id number]]

## 2021-11-03 NOTE — Telephone Encounter (Signed)
Sent in RX to [facility], attempted to leave VM to inform patient unable to leave vm full

## 2021-11-03 NOTE — Telephone Encounter (Signed)
Error

## 2021-11-09 ENCOUNTER — Ambulatory Visit
Admission: RE | Admit: 2021-11-09 | Discharge: 2021-11-09 | Disposition: A | Discharge status: Home / Self Care | Payer: Medicare (Managed Care) | Source: Ambulatory Visit | Attending: Urology | Admitting: Urology

## 2021-11-09 ENCOUNTER — Other Ambulatory Visit: Payer: Self-pay

## 2021-11-09 DIAGNOSIS — N1330 Unspecified hydronephrosis: Secondary | ICD-10-CM

## 2021-11-27 NOTE — Progress Notes (Incomplete)
11/27/21 ?12:34 PM  ? ?Kari Woods ?1941/03/26 ?[id number] ? ?Referring provider:  ?No referring provider defined for this encounter. ?No chief complaint on file. ? ? ?Urological history  ?History of bladder cancer  ?- S/p cystectectomy with [place] pouch in 2004 ?- Self-catheterization 4 times daily and irrigations  ? ?2. 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 ?3. History of kidney stones and chronic righ hydronephrosis  ?- RUS on 11/09/2021 visualized mild right hydronephrosis and bilateral renal cortical thinning. Her bladder is surgically absent.  ? ? ? ?HPI: ?Kari Woods is a 81 y.o.female who presents today for a 1 month follow-up with KUB and RUS.  ? ? ?Patient denies any modifying or aggravating factors.  Patient denies any gross hematuria, dysuria or suprapubic/flank pain.  Patient denies any fevers, chills, nausea or vomiting.  ? ? ? ? ?PMH: ?Past Medical History:  ?Diagnosis Date  ? Cancer [facility])   ? Bladder cancer 2006, lymphoplasmatic lymphoma 2016, breast cancer 2017. 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 CKD (chronic kidney disease)   ? stage 3  ? Depression   ? Diabetes mellitus without complication ([place])   ? GERD (gastroesophageal reflux disease)   ? ? ?Surgical History: ?Past Surgical History:  ?Procedure Laterality Date  ? ABDOMINAL HYSTERECTOMY    ? total  ? IR CV LINE INJECTION  10/09/2021  ? triple bypass  09/07/2011  ? ? ?Home Medications:  ?Allergies as of 11/30/2021   ? ?   Reactions  ? Bee Venom Swelling, Other (See Comments)  ? Other reaction(s): Unknown ?Other reaction(s): Swelling/Edema ?Severity: Swelling/Edema - Moderate; Type: Animal;  ?Other reaction(s): Swelling/Edema ?Severity: Swelling/Edema - Moderate; Type: Animal;   ? ?  ? ?  ?Medication List  ?  ? ?  ? Accurate as of November 27, 2021 12:34 PM. If you have any questions, ask your nurse or doctor.  ?  ?  ? ?  ? ?buPROPion 150 MG 24 hr  tablet ?Commonly known as: WELLBUTRIN XL ?Take 1 tablet (150 mg total) by mouth daily. ?  ?gabapentin 300 MG capsule ?Commonly known as: NEURONTIN ?Take 300 mg by mouth 2 (two) times daily. ?  ?pantoprazole 40 MG tablet ?Commonly known as: PROTONIX ?Take by mouth. ?  ?tobramycin 80 MG/2ML Soln injection ?Commonly known as: NEBCIN ?Inject 2 mLs (80 mg total) into the muscle daily. Mix 98m into 252mof saline. Instill whole 40 ml in bladder. Allow 30-1 hour, then drain once per day. ?  ? ?  ? ? ?Allergies:  ?Allergies  ?Allergen Reactions  ? Bee Venom Swelling and Other (See Comments)  ?  Other reaction(s): Unknown ?Other reaction(s): Swelling/Edema ?Severity: Swelling/Edema - Moderate; Type: Animal;  ?Other reaction(s): Swelling/Edema ?Severity: Swelling/Edema - Moderate; Type: Animal;  ?  ? ? ?Family History: ?No family history on file. ? ?Social History:  reports that she has quit smoking. Her smoking use included cigarettes. She has never used smokeless tobacco. She reports that she does not currently use alcohol. She reports that she does not currently use drugs. ? ? ?Physical Exam: ?There were no vitals taken for this visit.  ?Constitutional:  Alert and oriented, No acute distress. ?HEENT: [place] AT, moist mucus membranes.  Trachea midline, no masses. ?Cardiovascular: No clubbing, cyanosis, or edema. ?Respiratory: Normal respiratory effort, no increased work of breathing. ?GI: Abdomen is soft, nontender, nondistended, no abdominal masses ?GU: No CVA tenderness ?Lymph: No  cervical or inguinal lymphadenopathy. ?Skin: No rashes, bruises or suspicious lesions. ?Neurologic: Grossly intact, no focal deficits, moving all 4 extremities. ?Psychiatric: Normal mood and affect. ? ? ?Laboratory Data: ?Lab Results  ?Component Value Date  ? CREATININE 1.40 (H) 10/11/2021  ? ?Lab Results  ?Component Value Date  ? HGBA1C 7.5 (H) 10/08/2021  ? ?Component ?    Latest Ref Rng 10/10/2021 10/11/2021  ?Sodium ?    135 - 145 mmol/L 136     ?Potassium ?    3.5 - 5.1 mmol/L 4.4    ?Chloride ?    98 - 111 mmol/L 110    ?CO2 ?    22 - 32 mmol/L 21 (L)    ?Glucose ?    70 - 99 mg/dL 192 (H)    ?BUN ?    8 - 23 mg/dL 19    ?Creatinine ?    0.44 - 1.00 mg/dL 1.30 (H)  1.40 (H)   ?Calcium ?    8.9 - 10.3 mg/dL 7.8 (L)    ?GFR, Estimated ?    >60 mL/min 42 (L)  39 (L)   ?Anion gap ?    5 - 15  5    ?  ?(L) Low ?(H) High ? ?Urinalysis ? ? ?Pertinent Imaging: ?CLINICAL DATA:  History of hydronephrosis. History of chronic renal ?disease. History of cystectomy [place] pouch. ?  ?EXAM: ?RENAL / URINARY TRACT ULTRASOUND COMPLETE ?  ?COMPARISON:  No recent. ?  ?FINDINGS: ?Right Kidney: ?  ?Renal measurements: 8.1 x 2.9 x 4.1 cm = volume: 48.7 mL. Renal ?cortical thinning. Echogenicity normal. Mild hydronephrosis cannot ?be excluded. ?  ?Left Kidney: ?  ?Renal measurements: 10.6 x 5.5 x 4.1 cm = volume: 112.3 ML. Renal ?cortical thinning. Echogenicity normal. No hydronephrosis. ?  ?Bladder: ?  ?The bladder is surgically absent. Fluid collection noted over the ?right abdomen, this is most likely the patient's urinary diversion ?pouch. ?  ?Other: ?  ?None. ?  ?IMPRESSION: ?1. Mild right hydronephrosis cannot be excluded. Bilateral renal ?cortical thinning noted. ?  ?2. The bladder is surgically absent. Fluid collection noted over the ?right abdomen, this is most likely the patient's urinary diversion ?pouch. If further evaluation is needed CT of the abdomen pelvis can ?be obtained. ?  ?  ?Electronically Signed ?  By: Marcello Moores  Register M.D. ?  On: 11/10/2021 07:00 ? ? ?I have personally reviewed the images and agree with radiologist interpretation.  ? ? ?Assessment & Plan:   ? ? ?No follow-ups on file. ? ?[facility] ?[address], [address] ?[place], [place] [address] ?[phone] ? ?I,Kailey Littlejohn,acting as a Education administrator for [employer], PA-C.,have documented all relevant documentation on the behalf of [place], PA-C,as directed  by  [facility], PA-C while in the presence of SHANNON MCGOWAN, PA-C. ?

## 2021-11-30 ENCOUNTER — Ambulatory Visit: Payer: Medicare (Managed Care) | Admitting: Urology

## 2021-11-30 ENCOUNTER — Telehealth: Payer: Self-pay | Admitting: Urology

## 2022-02-24 NOTE — Telephone Encounter (Signed)
Mrs. Kari Woods needed a KUB at the time she had her RUS, so will you have her get the KUB.

## 2022-02-24 NOTE — Telephone Encounter (Signed)
Attempted to reach pt, no answer, no VM setup. Will attempt again tomorrow.

## 2022-02-25 NOTE — Telephone Encounter (Signed)
Attempt #2 - no answer, no VM.

## 2023-07-20 IMAGING — CT CT HEAD W/O CM
4 series · 16 of 47 positions shown, 18 images · non-contrast
Comparison: None.

CLINICAL DATA: Headache, new or worsening (Age >= 50y).



[Series 2: head wo · axial · 0.43mm/px · z∈[-93,+32]mm · 7 of 35 slices shown, 9 images]
[im 5/35  brain]
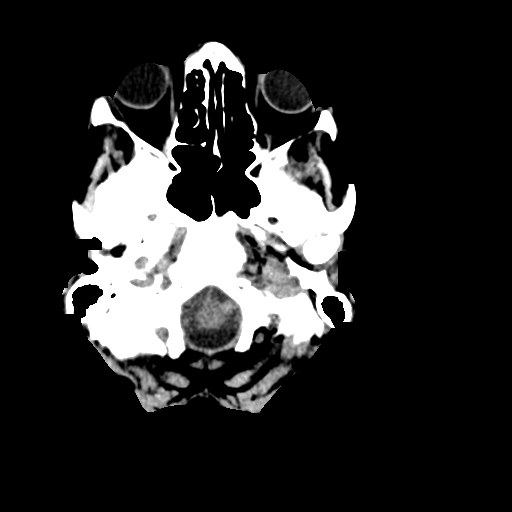
[im 5/35  bone]
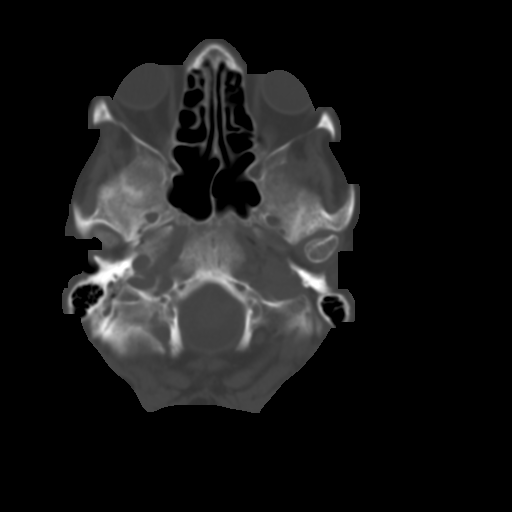
[im 9/35  brain]
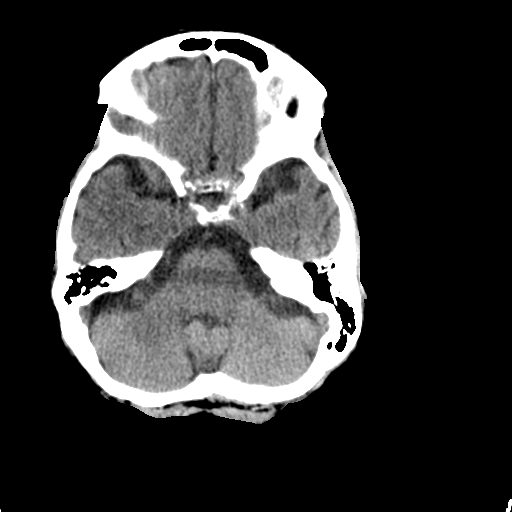
[im 13/35  brain]
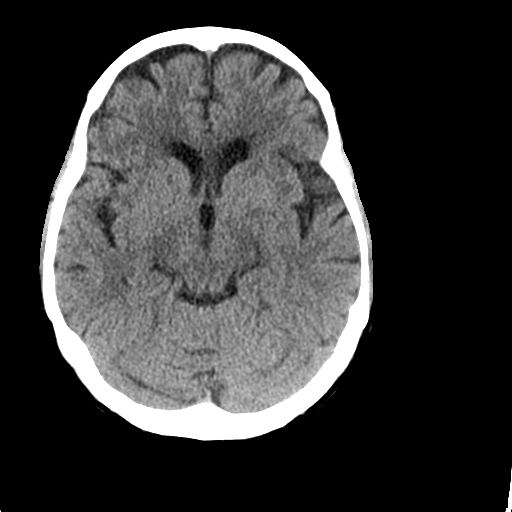
[im 18/35  brain]
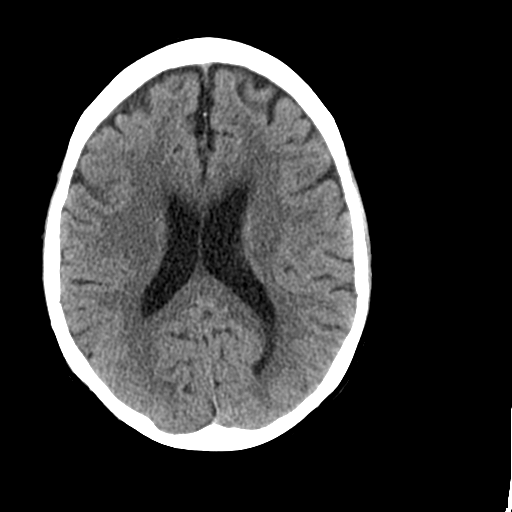
[im 22/35  brain]
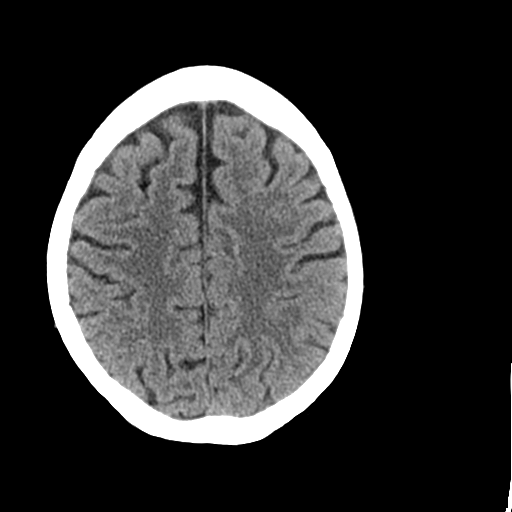
[im 22/35  bone]
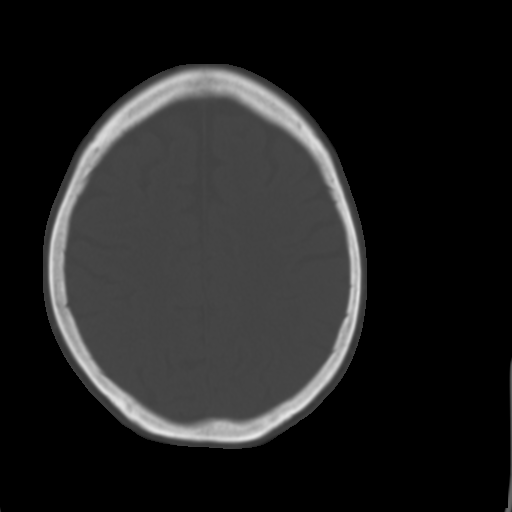
[im 26/35  brain]
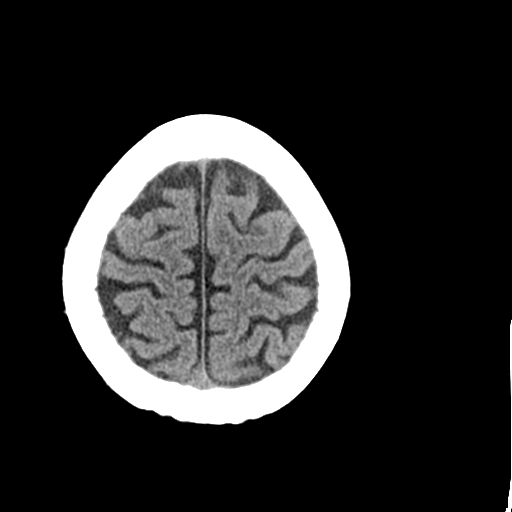
[im 30/35  brain]
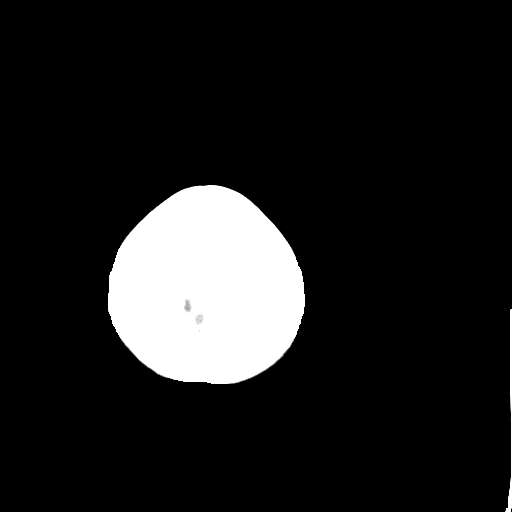

[Series 3: head bone · axial · 0.43mm/px · z∈[-97,-63]mm · 3 of 86 slices shown]
[im 9/86  bone]
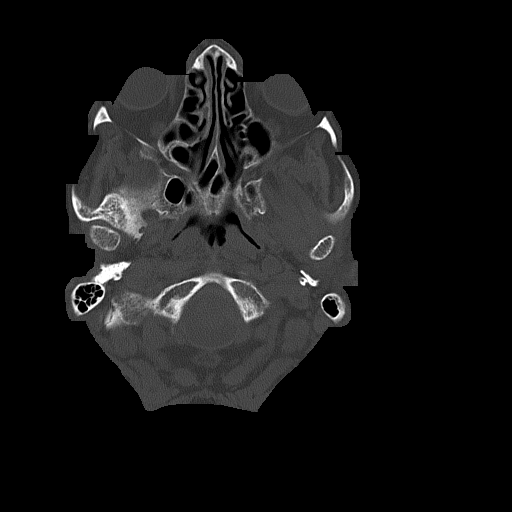
[im 18/86  bone]
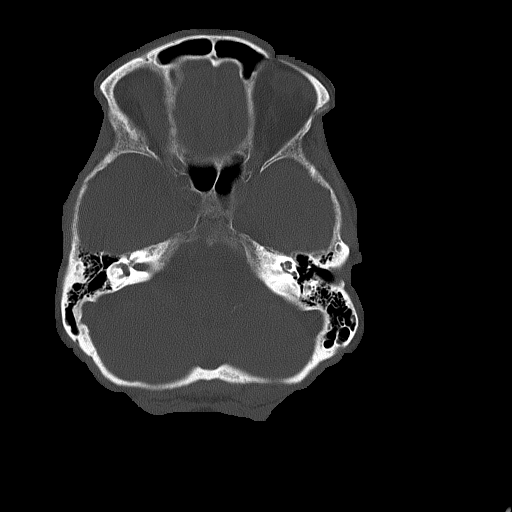
[im 26/86  bone]
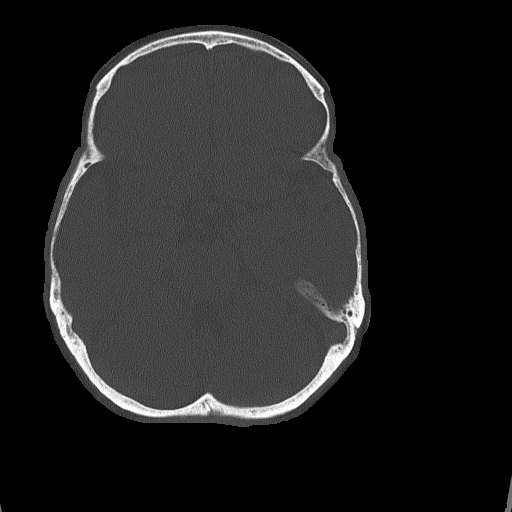

[Series 4: coronal soft tissue · coronal · 0.40mm/px · 3 of 75 slices shown]
[im 25/75  brain]
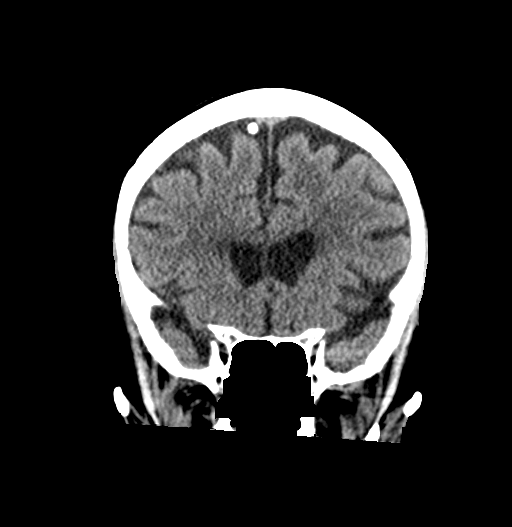
[im 33/75  brain]
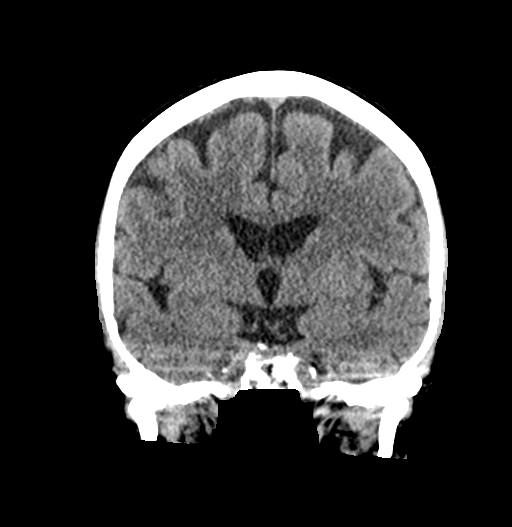
[im 42/75  brain]
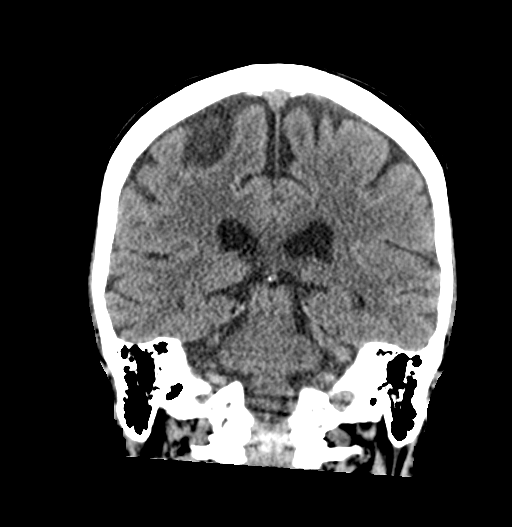

[Series 5: sagittal soft tissue · sagittal · 0.35mm/px · 3 of 55 slices shown]
[im 19/55  brain]
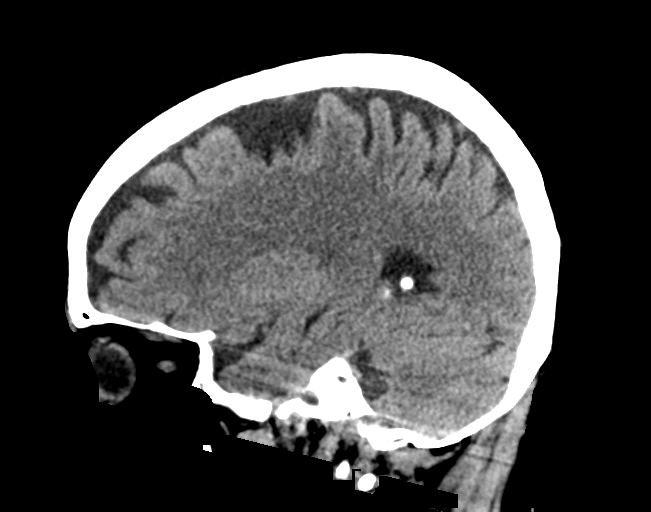
[im 28/55  brain]
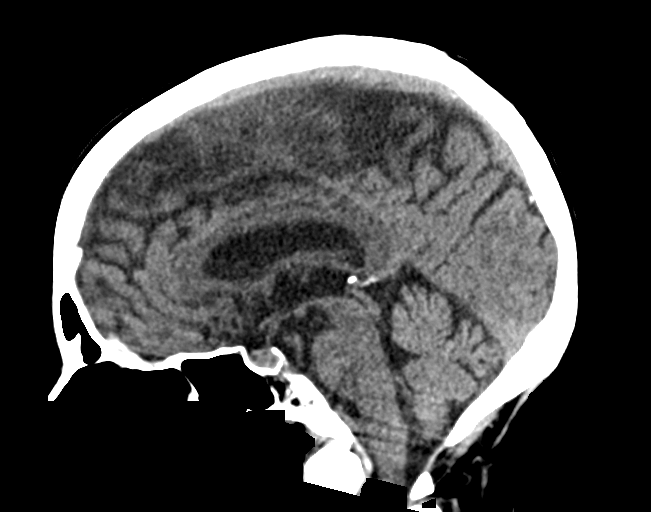
[im 37/55  brain]
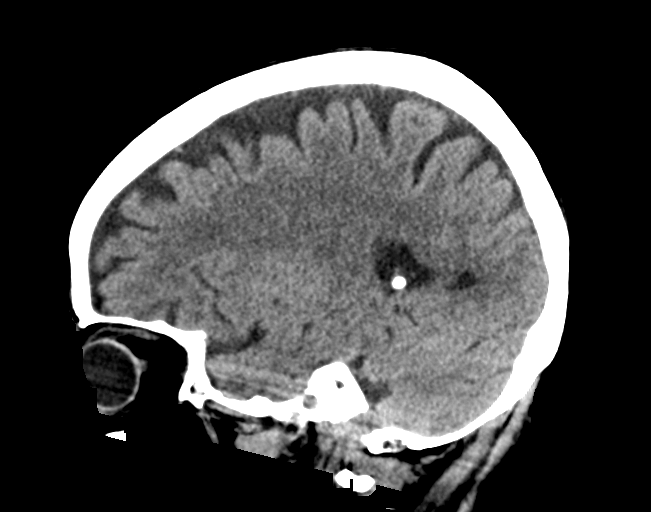

[16 of 47 positions shown; findings below may reference images not displayed]

FINDINGS: Brain: Normal anatomic configuration. No abnormal intra or
extra-axial mass lesion or fluid collection. No abnormal mass effect
or midline shift. No evidence of acute intracranial hemorrhage or
infarct. Ventricular size is normal. Cerebellum unremarkable.

Vascular: Unremarkable

Skull: Intact

Sinuses/Orbits: Paranasal sinuses are clear. Ocular lenses have been
removed. Orbits are otherwise unremarkable.

Other: Mastoid air cells and middle ear cavities are clear.
IMPRESSION: No acute intracranial abnormality.
